# Patient Record
Sex: Male | Born: 1955
Health system: Southern US, Community
[De-identification: ages and names within clinical notes are randomized; demographics above are authoritative.]

---

## 2003-02-06 ENCOUNTER — Ambulatory Visit (HOSPITAL_BASED_OUTPATIENT_CLINIC_OR_DEPARTMENT_OTHER): Admission: RE | Admit: 2003-02-06 | Discharge: 2003-02-06 | Payer: Self-pay | Admitting: Orthopedic Surgery

## 2007-10-08 ENCOUNTER — Encounter (INDEPENDENT_AMBULATORY_CARE_PROVIDER_SITE_OTHER): Payer: Self-pay | Admitting: Surgery

## 2007-10-08 ENCOUNTER — Ambulatory Visit (HOSPITAL_BASED_OUTPATIENT_CLINIC_OR_DEPARTMENT_OTHER): Admission: RE | Admit: 2007-10-08 | Discharge: 2007-10-08 | Payer: Self-pay | Admitting: Surgery

## 2010-06-03 ENCOUNTER — Ambulatory Visit (HOSPITAL_BASED_OUTPATIENT_CLINIC_OR_DEPARTMENT_OTHER)
Admission: RE | Admit: 2010-06-03 | Discharge: 2010-06-03 | Disposition: A | Discharge status: Home / Self Care | Payer: BC Managed Care – PPO | Source: Ambulatory Visit | Attending: Orthopedic Surgery | Admitting: Orthopedic Surgery

## 2010-06-03 DIAGNOSIS — M7500 Adhesive capsulitis of unspecified shoulder: Secondary | ICD-10-CM | POA: Insufficient documentation

## 2010-06-03 DIAGNOSIS — M25819 Other specified joint disorders, unspecified shoulder: Secondary | ICD-10-CM | POA: Insufficient documentation

## 2010-06-03 DIAGNOSIS — Z01810 Encounter for preprocedural cardiovascular examination: Secondary | ICD-10-CM | POA: Insufficient documentation

## 2010-06-03 DIAGNOSIS — Z01812 Encounter for preprocedural laboratory examination: Secondary | ICD-10-CM | POA: Insufficient documentation

## 2010-06-07 NOTE — Op Note (Signed)
  NAME:  Daniel Shannon, Daniel Shannon NO.:  [id number]  MEDICAL RECORD NO.:  [id number]          PATIENT TYPE:  LOCATION:                                 FACILITY:  PHYSICIAN:  Loreta Ave, M.D.      DATE OF BIRTH:  DATE OF PROCEDURE: DATE OF DISCHARGE:                              OPERATIVE REPORT   PREOPERATIVE DIAGNOSIS:  Left shoulder impingement, distal clavicle osteolysis secondary to marked adhesive capsulitis.  POSTOPERATIVE DIAGNOSIS:  Left shoulder impingement, distal clavicle osteolysis secondary to marked adhesive capsulitis.  PROCEDURE:  Left shoulder exam manipulation under anesthesia. Arthroscopy with lysis and debridement of intra and extra-articular adhesions.  Bursectomy, acromioplasty, CA ligament release.  Excision of distal clavicle.  SURGEON:  Loreta Ave, MD  ASSISTANT:  Genene Churn. Barry Dienes, [place], present throughout the entire case necessary for timely completion of procedure.  ANESTHESIA:  General  BLOOD LOSS:  Minimal.  SPECIMEN:  None.  COMPLICATIONS:  None.  DRESSINGS:  Soft compressive with sling.  PROCEDURE IN DETAIL:  The patient brought to the operating room and after adequate anesthesia had been obtained, shoulder examined.  Lacking motion by 50% from dense adhesions from adhesive capsulitis.  Gently manipulated, break up all adhesions achieving full motion stable shoulder.  Placed in beach-chair position on the shoulder positioner and prepped and draped in usual sterile fashion.  Three portals anterior, posterior, and lateral.  Arthroscope introduced through the posterior portal.  Shoulder distended and inspected.  Residual adhesions and synovitis debrided.  Tearing of the inferior capsule from manipulation confirmed.  Articular cartilage, labrum, biceps tendon, biceps anchor, undersurface cuff all looked good.  Cannula redirected subacromially. Fair amount of adhesions there as well.  Debrided out.  Reactive bursitis  debrided.  Type II acromion.  Acromioplasty to a type 1 acromion with shaver and high-speed bur releasing CA ligament with cautery.  Distal clavicle exposed.  Periarticular spurs.  Grade 4 changes.  Periarticular spurs and lateral centimeter of clavicle resected.  Roughening on the top of the cuff also thoroughly debrided out.  At completion adequacy of acromioplasty, distal clavicle excision confirmed.  Instruments and fluid removed.  Portals closed with nylon.  Sterile compressive dressing applied.  Sling applied.  Anesthesia reversed.  Brought to recovery room.  Tolerated surgery well.  No complications.     Loreta Ave, M.D.     DFM/MEDQ  D:  06/03/2010  T:  06/04/2010  Job:  [id number]  Electronically Signed by Mckinley Jewel M.D. on 06/07/2010 02:59:19 PM

## 2010-08-31 NOTE — Op Note (Signed)
NAMEGREGORI, Daniel Shannon                ACCOUNT NO.:  [id number]   MEDICAL RECORD NO.:  [id number]          PATIENT TYPE:  AMB   LOCATION:  DSC                          FACILITY:  [facility]   PHYSICIAN:  Currie Paris, M.D.DATE OF BIRTH:  1955-07-26   DATE OF PROCEDURE:  10/08/2007  DATE OF DISCHARGE:                               OPERATIVE REPORT   PREOPERATIVE DIAGNOSES:  1. Skin lesion, right forearm question basal cell.  2. Skin lesions x2, left upper arm question basal cell.   CLINICAL HISTORY:  This patient is a 55 year old who has had a prior  basal cell removed from his arm and has a second lesion now at the  distal right forearm that is a little bit raised and irregular in shape.  We elected to remove this.  In addition, there were 2 adjacent lesions  in the left upper arm that although had been present for years.  He had  become concerned about given his prior history of basal cell and he  elected to remove those as well.   DESCRIPTION OF PROCEDURE:  The patient was seen in the minor procedure  room and confirmed the plans as noted above.  All three lesions were  identified and marked.   Both areas were then prepped with some alcohol and infiltrated with 1%  Xylocaine with epinephrine.  I waited about 10 minutes and began first  on the right forearm lesion.  The area was prepped with Betadine.  An  elliptical incision was made in the area taken out with about a 3-cm  incision.  The lesion itself measured 12 mm.   This incision was closed with a series of interrupted 3-0 Vicryl subcu  and then a 4-0 running Prolene skin.   Two lesions of the forearm were then addressed.  I outlined and marked a  couple of varieties of incisions including two separate incisions that  were paralleled, one somewhat longer but transverse the oriented  incision that I thought would encompass both lesions.  They were close  together and I did not think a long vertical incision was going to  properly get both of these and have a skin bridge that would be healthy.  This area had been infiltrated at the beginning of the case and was  infiltrated with a little bit of more local.  This was then prepped with  Betadine and an elliptical incision was made encompassing both lesions.  This was again closed with 3-0 Vicryl and 4-0 Prolene.   The patient tolerated the procedure well and no complications noted.      Currie Paris, M.D.  Electronically Signed     CJS/MEDQ  D:  10/08/2007  T:  10/09/2007  Job:  [id number]

## 2010-09-03 NOTE — Op Note (Signed)
NAMECHADRIC, Daniel Shannon NO.:  [id number]   MEDICAL RECORD NO.:  [id number]                   PATIENT TYPE:  AMB   LOCATION:  DSC                                  FACILITY:  [facility]   PHYSICIAN:  Loreta Ave, M.D.              DATE OF BIRTH:  1955/11/26   DATE OF PROCEDURE:  02/06/2003  DATE OF DISCHARGE:                                 OPERATIVE REPORT   PREOPERATIVE DIAGNOSES:  1. Chronic impingement, right shoulder, with partial thickness tear of the     rotator cuff.  2. 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  3. Marked reactive secondary adhesive capsulitis.   POSTOPERATIVE DIAGNOSES:  1. Chronic impingement, right shoulder, with partial thickness tear of the     rotator cuff.  2. 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  3. Marked reactive secondary adhesive capsulitis.  4. Additional labral tears.   PROCEDURE:  1. Right shoulder examination with manipulation under anesthesia, followed     by an arthroscopy with lysis and debridement of intra-articular and extra-     articular adhesions.  2. Debridement of labrum.  3. Debridement of the rotator cuff.  4. Acromioplasty.  5. Release of the ligament.  6. Excision of distal clavicle.   SURGEON:  Loreta Ave, M.D.   ASSISTANT:  Arlys John D. Petrarca, P.A.-C.   ANESTHESIA:  General.   ESTIMATED BLOOD LOSS:  Minimal.   SPECIMENS:  None.   CULTURES:  None.   COMPLICATIONS:  None.   DRESSING:  A soft compressive with a sling.   DESCRIPTION OF PROCEDURE:  The patient is brought to the operating room and  placed on the operating room table in the supine position.  After adequate  anesthesia had been obtained, the right shoulder was examined.  Marked  adhesive capsulitis with a reduction of motion more than 50% in all planes.  Under general anesthesia with gentle manipulation, I was able to break up  all adhesions and achieve a full motion of the  shoulder and maintain a  stable shoulder without adverse occurrence.  Following this, placed in the  beach chair position in the shoulder positioner.  Prepped and draped in the  usual sterile fashion.  Three standard portals were anterior to  posterolateral.  The shoulder entered with a blunt obturator and distended  and expected.  Marked intra-articular adhesions debrided.  The  circumferential tear of the labrum was debrided.  The articular cartilage  and capsule and ligamentous structures were under the rotator cuff.  The  biceps tendon, biceps anchor otherwise intact.  The cannula was redirected  subacromially.  Marked extra-articular adhesions, bursitis, and impingement.  The bursa was resected.  The adhesions were debrided.  The acromioplasty  from the type 3 to type 1 acromion, releasing the CA  ligament and debriding  the top of the cuff.  No full thickness tears  to require overt repair.  Grade 3 changes to the [facility] joint with spurring contributing to impingement.  The lateral 1.0 cm of clavicle was resected.  At the completion, adequate CA  decompression.  Clavicle incision and debridement of the cuff, and  debridement of adhesions confirmed viewing from all portals.  The  instruments and fluid were removed.  The portals were found, immersed and  injected with Marcaine.  The portals were closed with #4-0 nylon.  A sterile  compressive dressing was applied.  The anesthesia was reversed.  The patient was brought to the recovery room.  The patient tolerated the  surgery well with no complications.                                                Loreta Ave, M.D.    DFM/MEDQ  D:  02/06/2003  T:  02/06/2003  Job:  [id number]

## 2017-02-10 ENCOUNTER — Other Ambulatory Visit: Payer: Self-pay | Admitting: Surgery

## 2019-01-30 DIAGNOSIS — Z03818 Encounter for observation for suspected exposure to other biological agents ruled out: Secondary | ICD-10-CM | POA: Diagnosis not present

## 2019-05-26 DIAGNOSIS — F5105 Insomnia due to other mental disorder: Secondary | ICD-10-CM | POA: Diagnosis not present

## 2019-05-26 DIAGNOSIS — F39 Unspecified mood [affective] disorder: Secondary | ICD-10-CM | POA: Diagnosis not present

## 2019-05-27 ENCOUNTER — Inpatient Hospital Stay (HOSPITAL_COMMUNITY)
Admission: AD | Admit: 2019-05-27 | Discharge: 2019-06-04 | DRG: 885 | Disposition: A | Discharge status: Home / Self Care | Payer: BC Managed Care – PPO | Source: Intra-hospital | Attending: Psychiatry | Admitting: Psychiatry

## 2019-05-27 ENCOUNTER — Other Ambulatory Visit: Payer: Self-pay

## 2019-05-27 ENCOUNTER — Encounter (HOSPITAL_COMMUNITY): Payer: Self-pay | Admitting: Registered Nurse

## 2019-05-27 ENCOUNTER — Emergency Department (HOSPITAL_COMMUNITY)
Admission: EM | Admit: 2019-05-27 | Discharge: 2019-05-27 | Disposition: A | Discharge status: Other Acute Inpatient Hospital | Payer: BC Managed Care – PPO | Source: Home / Self Care

## 2019-05-27 DIAGNOSIS — Z20822 Contact with and (suspected) exposure to covid-19: Secondary | ICD-10-CM | POA: Diagnosis not present

## 2019-05-27 DIAGNOSIS — F312 Bipolar disorder, current episode manic severe with psychotic features: Secondary | ICD-10-CM

## 2019-05-27 DIAGNOSIS — E559 Vitamin D deficiency, unspecified: Secondary | ICD-10-CM | POA: Diagnosis not present

## 2019-05-27 DIAGNOSIS — Z23 Encounter for immunization: Secondary | ICD-10-CM

## 2019-05-27 DIAGNOSIS — R4781 Slurred speech: Secondary | ICD-10-CM | POA: Diagnosis not present

## 2019-05-27 DIAGNOSIS — F309 Manic episode, unspecified: Secondary | ICD-10-CM

## 2019-05-27 DIAGNOSIS — F308 Other manic episodes: Secondary | ICD-10-CM | POA: Insufficient documentation

## 2019-05-27 DIAGNOSIS — Z79899 Other long term (current) drug therapy: Secondary | ICD-10-CM

## 2019-05-27 DIAGNOSIS — F22 Delusional disorders: Secondary | ICD-10-CM | POA: Diagnosis not present

## 2019-05-27 DIAGNOSIS — G4700 Insomnia, unspecified: Secondary | ICD-10-CM | POA: Diagnosis not present

## 2019-05-27 DIAGNOSIS — Z9114 Patient's other noncompliance with medication regimen: Secondary | ICD-10-CM

## 2019-05-27 DIAGNOSIS — Z818 Family history of other mental and behavioral disorders: Secondary | ICD-10-CM

## 2019-05-27 DIAGNOSIS — I10 Essential (primary) hypertension: Secondary | ICD-10-CM | POA: Diagnosis not present

## 2019-05-27 DIAGNOSIS — G9389 Other specified disorders of brain: Secondary | ICD-10-CM | POA: Diagnosis not present

## 2019-05-27 DIAGNOSIS — F319 Bipolar disorder, unspecified: Principal | ICD-10-CM | POA: Diagnosis present

## 2019-05-27 DIAGNOSIS — F29 Unspecified psychosis not due to a substance or known physiological condition: Secondary | ICD-10-CM | POA: Diagnosis not present

## 2019-05-27 DIAGNOSIS — F419 Anxiety disorder, unspecified: Secondary | ICD-10-CM | POA: Diagnosis present

## 2019-05-27 DIAGNOSIS — Z03818 Encounter for observation for suspected exposure to other biological agents ruled out: Secondary | ICD-10-CM | POA: Diagnosis not present

## 2019-05-27 LAB — COMPREHENSIVE METABOLIC PANEL
ALT: 33 U/L (ref 0–44)
AST: 38 U/L (ref 15–41)
Albumin: 4.5 g/dL (ref 3.5–5.0)
Alkaline Phosphatase: 64 U/L (ref 38–126)
Anion gap: 11 (ref 5–15)
BUN: 21 mg/dL (ref 8–23)
CO2: 21 mmol/L — ABNORMAL LOW (ref 22–32)
Calcium: 9 mg/dL (ref 8.9–10.3)
Chloride: 105 mmol/L (ref 98–111)
Creatinine, Ser: 1.29 mg/dL — ABNORMAL HIGH (ref 0.61–1.24)
GFR calc Af Amer: >60 mL/min (ref >60)
GFR calc non Af Amer: 58 mL/min — ABNORMAL LOW (ref >60)
Glucose, Bld: 133 mg/dL — ABNORMAL HIGH (ref 70–99)
Potassium: 3.9 mmol/L (ref 3.5–5.1)
Sodium: 137 mmol/L (ref 135–145)
Total Bilirubin: 1.6 mg/dL — ABNORMAL HIGH (ref 0.3–1.2)
Total Protein: 7.4 g/dL (ref 6.5–8.1)

## 2019-05-27 LAB — CBC WITH DIFFERENTIAL/PLATELET
Abs Immature Granulocytes: 0.03 10*3/uL (ref 0.00–0.07)
Basophils Absolute: 0.1 10*3/uL (ref 0.0–0.1)
Basophils Relative: 0 %
Eosinophils Absolute: 0 10*3/uL (ref 0.0–0.5)
Eosinophils Relative: 0 %
HCT: 43 % (ref 39.0–52.0)
Hemoglobin: 14.6 g/dL (ref 13.0–17.0)
Immature Granulocytes: 0 %
Lymphocytes Relative: 30 %
Lymphs Abs: 3.4 10*3/uL (ref 0.7–4.0)
MCH: 32.1 pg (ref 26.0–34.0)
MCHC: 34 g/dL (ref 30.0–36.0)
MCV: 94.5 fL (ref 80.0–100.0)
Monocytes Absolute: 0.9 10*3/uL (ref 0.1–1.0)
Monocytes Relative: 8 %
Neutro Abs: 7 10*3/uL (ref 1.7–7.7)
Neutrophils Relative %: 62 %
Platelets: 207 10*3/uL (ref 150–400)
RBC: 4.55 MIL/uL (ref 4.22–5.81)
RDW: 12.1 % (ref 11.5–15.5)
WBC: 11.4 10*3/uL — ABNORMAL HIGH (ref 4.0–10.5)
nRBC: 0 % (ref 0.0–0.2)

## 2019-05-27 LAB — RESPIRATORY PANEL BY RT PCR (FLU A&B, COVID)
Influenza A by PCR: NEGATIVE
Influenza B by PCR: NEGATIVE
SARS Coronavirus 2 by RT PCR: NEGATIVE

## 2019-05-27 LAB — VALPROIC ACID LEVEL: Valproic Acid Lvl: 15 ug/mL — ABNORMAL LOW (ref 50.0–100.0)

## 2019-05-27 LAB — ETHANOL: Alcohol, Ethyl (B): <10 mg/dL (ref <10)

## 2019-05-27 MED ORDER — DIVALPROEX SODIUM 250 MG PO DR TAB
250.0000 mg | DELAYED_RELEASE_TABLET | Freq: Three times a day (TID) | ORAL | Status: DC
  Administered 2019-05-27 – 2019-05-28 (×2): 250 mg via ORAL
  Filled 2019-05-27 (×7): qty 1

## 2019-05-27 MED ORDER — OLANZAPINE 10 MG IM SOLR
10.0000 mg | Freq: Three times a day (TID) | INTRAMUSCULAR | Status: DC | PRN
  Filled 2019-05-27: qty 10

## 2019-05-27 MED ORDER — LORAZEPAM 1 MG PO TABS
1.0000 mg | ORAL_TABLET | ORAL | Status: DC | PRN
  Administered 2019-05-28: 1 mg via ORAL
  Filled 2019-05-27: qty 1

## 2019-05-27 MED ORDER — OLANZAPINE 10 MG IM SOLR: 10.0000 mg | Freq: Three times a day (TID) | INTRAMUSCULAR | Status: DC

## 2019-05-27 MED ORDER — ZIPRASIDONE MESYLATE 20 MG IM SOLR
20.0000 mg | Freq: Once | INTRAMUSCULAR | Status: AC
  Administered 2019-05-27: 20 mg via INTRAMUSCULAR
  Filled 2019-05-27: qty 20

## 2019-05-27 MED ORDER — STERILE WATER FOR INJECTION IJ SOLN
INTRAMUSCULAR | Status: AC
  Filled 2019-05-27: qty 10

## 2019-05-27 MED ORDER — OLANZAPINE 10 MG IM SOLR: 10.0000 mg | Freq: Three times a day (TID) | INTRAMUSCULAR | Status: DC | PRN

## 2019-05-27 MED ORDER — TEMAZEPAM 30 MG PO CAPS
30.0000 mg | ORAL_CAPSULE | Freq: Every day | ORAL | Status: DC
  Filled 2019-05-27: qty 1

## 2019-05-27 MED ORDER — LORAZEPAM 2 MG/ML IJ SOLN: 2.0000 mg | INTRAMUSCULAR | Status: DC | PRN

## 2019-05-27 MED ORDER — DIVALPROEX SODIUM 125 MG PO CSDR
250.0000 mg | DELAYED_RELEASE_CAPSULE | Freq: Three times a day (TID) | ORAL | Status: DC
  Filled 2019-05-27 (×3): qty 2

## 2019-05-27 MED ORDER — OLANZAPINE 10 MG PO TABS
10.0000 mg | ORAL_TABLET | Freq: Two times a day (BID) | ORAL | Status: DC
  Administered 2019-05-27 – 2019-05-28 (×2): 10 mg via ORAL
  Filled 2019-05-27 (×5): qty 1

## 2019-05-27 MED ORDER — LORAZEPAM 1 MG PO TABS
2.0000 mg | ORAL_TABLET | ORAL | Status: DC | PRN
  Filled 2019-05-27: qty 2

## 2019-05-27 NOTE — ED Triage Notes (Signed)
Pt presents to ED brought in by neighbor and family for delusions and abnormal, manic like behavior.

## 2019-05-27 NOTE — ED Notes (Signed)
Pt provided with hospital bed. Pt also provided with paper and pencil per his request. Pt is calm and cooperative at this time.

## 2019-05-27 NOTE — ED Notes (Signed)
Sitter has been assigned to sit with pt.

## 2019-05-27 NOTE — ED Notes (Signed)
Pharmacy reports that Cephus Shelling, MD prescribed pts new meds that were started yesterday.

## 2019-05-27 NOTE — Progress Notes (Signed)
Patient ID: Daniel Shannon, male   DOB: 07-Jun-1955, 64 y.o.   MRN: [id number]  Admission Note  Pt is a 64 yo male that presents IVC'd on 05/27/2019 with worsening hyperactivity, anxiety, agitation, compulsions, and insomnia. Pt states they are unsure as to how they came to the hospital. Pt was IVC'd and is preoccupied with being here without the magistrate and MD knowing "who I am". Pt is fixated on scribing their conversations with others. Pt is a poor history but pleasant. Pt states they are willing to take medications but wants to be "explained what they are and what they do". Pt is fixated on discharge but understands they cannot go. "My best option would be to sleep in my bed at home. I know I need sleep". Pt denies any physical pain. Pt denies any physical symptoms. Pt did state their sister was admitted for bipolar in 1994. Pt states they had multiple stressors leading up to this event that they can recall including helping a brother with their medical practice. Pt states they live at home with wife and kids. Pt is a Education officer, environmental at [employer]. Pt is pleasant. Pt denies si/hi/ah/vh and verbally agrees to approach staff if these become apparent or before harming themself/others while at [place] signed, skin/belongings search completed and patient oriented to unit. Patient stable at this time. Patient given the opportunity to express concerns and ask questions. Patient given toiletries. Will continue to monitor.   [place] Assessment 05/27/2019:  Daniel Shannon is an 64 y.o. male that presents this date with IVC due to acute psychosis. Per IVC, Patient has been exhibiting manic behaviors and hasn't been sleeping. Patient is observed to be paranoid. He went to the home of the hospital CEO last night unannounced. He did this because he was paranoid about his phone "pinging" and he thought he needed to discuss this with CEO despite not actually knowing him. Patient is observed to be tangential and renders limited  history as this Probation officer attempts to assess. Patient per notes by EDP Kohut MD on arrival, states patient was brought in by his wife earlier for a psychiatric evaluation. 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 Patient has had paranoid thoughts which led him to go the home of the hospital CEO unannounced prior to arrival. Patient is observed to be very agitated as this Probation officer attempts to assess. Patient is refusing to participate in the assessment and is stating he will not answer any questions associated with his current mental health condition without the interview being recorded. Patient is observed to be writing everything down and is asking for random names of individuals he feels are associated with his care. Patient voices "everyone is after him" although will not elaborate on content of statement. Patient continues to offer this Probation officer financial compensation to "tell him the truth." This Probation officer attempts to redirect patient unsuccessfully. Patient will talk at length on various unrelated topics informing this writer he will answer any questions if I will go get "two five dollar bills." When this writer informs patient that is not possible patient states he will "answer all relevant questions." Patient denies any S/I,H/I or AVH. Patient denies any prior attempts or gestures at self harm Patient denies any past or current SA issues. Patient denies any prior mental health history although this writer is uncertain if patient is comprehending the content of this writer's questions.  Patient is observed to be hitting his bed with his hand and yelling "no, no, no, to everything you ask." This writer terminates interview as patient is observed to become highly agitated and is asking this Probation officer to "leave the room immediately." Patient declines to give consent to "speak to anyone" in reference to any  collateral information. History is limited per chart review. To note RN Lacinda Axon documented that Nicolasa Ducking MD prescribed patient new medication yesterday. See MAR. Patientpresentswith agitated affect and speaks in a loud voice with pressured speech. Patient will not answer orientation questions although states "I know where I am." Patient'smood wasangry and anxious. Patient'saffect was congruent with mood. Patient'sthought process was tangential and patient was observed to be in a highly manic state.Patient'sjudgement wasimpaired. Patient'sconcentration waspooras patient displayed some paranoia.Patient'sinsight and impulse control arepoor. Patient did not appear to be responding to internal stimuli.Case was staffed with Dwyane Dee MD who recommended a inpatient admission to assist with stabilization.

## 2019-05-27 NOTE — ED Notes (Signed)
Pt remains manic, states we are all lying to him. Will will not allow staff to complete a sentence prior to rambling on in a manic state. He is presently at desk with transport and will not leave facility due to his wife not being present to accompany him across to [facility].

## 2019-05-27 NOTE — BH Assessment (Signed)
[place] Assessment Progress Note   Pt unable to be assessed at this time.  Pt given geodon at 01:05.  Nurse Grandville Silos said pt was sleeping now.

## 2019-05-27 NOTE — ED Notes (Signed)
Pt remains in bed resting at this time. NAD noted. Equal rise and fall of chest noted.

## 2019-05-27 NOTE — BHH Suicide Risk Assessment (Signed)
[facility] Admission Suicide Risk Assessment   Total Time spent with patient: 45 minutes Principal Problem: New onset mania Diagnosis:  Active Problems:   Bipolar disorder ([place])  Subjective Data: New onset mania in the context of a family history and  insomnia and stress  Continued Clinical Symptoms:    The "Alcohol Use Disorders Identification Test", Guidelines for Use in Primary Care, Second Edition.  World Pharmacologist [facility]). Score between 0-7:  no or low risk or alcohol related problems. Score between 8-15:  moderate risk of alcohol related problems. Score between 16-19:  high risk of alcohol related problems. Score 20 or above:  warrants further diagnostic evaluation for alcohol dependence and treatment.   CLINICAL FACTORS:  Insomnia Musculoskeletal: Strength & Muscle Tone: within normal limits Gait & Station: normal Patient leans: N/A  Psychiatric Specialty Exam: Physical Exam  Nursing note and vitals reviewed. Constitutional: He appears well-developed and well-nourished.    Review of Systems  Neurological: Negative.   Generally very physically fit and runs half marathons is a triathlete so forth no alcohol or drug use  There were no vitals taken for this visit.There is no height or weight on file to calculate BMI.  General Appearance: Casual/in scrubs  Eye Contact:  Fair  Speech:  Pressured/rambling  Volume:  Increased  Mood:  manic  Affect:  Full Range  Thought Process:  Irrelevant and Descriptions of Associations: Circumstantial/very obsessive, obsessed with writing every detail down as it is occurring, refusing to answer multiple questions but each question of the mental status exam is a springboard for him to question the examiner  Orientation:  Full (Time, Place, and Person)  Thought Content:  Paranoid Ideation/thoughts are clearly racing and patient believes no one is advocating for him.  Suicidal Thoughts:  No  Homicidal Thoughts:  No  Memory:  unable to  test fromally due to continual deflection of questions   Judgement:  Impaired  Insight:  Lacking  Psychomotor Activity:  Restlessness  Concentration:  Concentration: Poor and Attention Span: Poor  Recall:  Poor  Fund of Knowledge:  Good  Language:  Good  Akathisia:  Negative  Handed:  Right  AIMS (if indicated):     Assets:  Agricultural consultant Housing Intimacy Physical Health Resilience Social Support Talents/Skills Transportation Vocational/Educational  ADL's:  Intact  Cognition:  Impaired,  Mild  Sleep:        COGNITIVE FEATURES THAT CONTRIBUTE TO RISK:  Polarized thinking    SUICIDE RISK:   Minimal: No identifiable suicidal ideation.  Patients presenting with no risk factors but with morbid ruminations; may be classified as minimal risk based on the severity of the depressive symptoms  PLAN OF CARE: Admit for stabilization  I certify that inpatient services furnished can reasonably be expected to improve the patient's condition.   Johnn Hai, MD 05/27/2019, 2:10 PM

## 2019-05-27 NOTE — ED Notes (Signed)
Pt ambulated to restroom before staff could ask him to provide a urine sample.

## 2019-05-27 NOTE — ED Notes (Signed)
Sheriff notified need for IVC transport to [facility]

## 2019-05-27 NOTE — H&P (Signed)
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  His brother had become depressed and the patient had been traveling to help him and going without sleep, in fact Friday night 2/5, did not sleep at all. (Other pertinent family history includes a sister with a bipolar disorder requiring hospitalization in 1994 but she is stable/functional, and a professor at [employer])  The patient however became progressively more manic, neighbors noted his behavior and were concerned, and by Saturday night he actually went to the home of the hospital CEO and handed over a note regarding apparently his concerns about a hospital merger and little or he ran down the steps and away- The patient saw Dr. Nicolasa Ducking on Saturday, and she was concerned about manic symptomatology prescribed quetiapine and Depakote, the patient did take 1 dose of one of the medications but was not fully compliant, and his behavior escalated to the current status requiring petition for involuntary commitment.  The patient is literally impossible to interview-he interrupts the examiner repeatedly, he insists on writing everything down, literally writing "I am coming in a room and it is cold" he requests a scribe to write everything  down.  He claims that we are not his advocates and that people are working against him.  When asked how I could be his advocate he states by releasing him.  Anything short of an immediate discharge means that we are not his advocate.  His wife confirms that he has been using that term repeatedly.  When I try to conduct a basic interview and history of the present illness he replies with "you do not know my story" "you do not know anything about me" and becomes argumentative.  He repeatedly takes over the interview and asks the examiner questions over and over and requires constant redirection. See mental status exam below  Further history indicates that is the 1 year anniversary of his mother's death Associated Signs/Symptoms: Depression Symptoms:  insomnia, psychomotor agitation, difficulty concentrating, disturbed sleep, (Hypo) Manic Symptoms:  Delusions, Distractibility, Flight of Ideas, Anxiety Symptoms:  denies Psychotic Symptoms:  Paranoia, PTSD Symptoms: NA Total Time spent with patient: 45 minutes  Past Psychiatric History: denies  Is the patient at risk to self? Yes.    Has the patient been a risk to self in the past 6 months? No.  Has the patient been a risk to self within the distant past? No.  Is the patient a risk to others? No.  Has the patient been a risk to others in the past 6 months? No.  Has the patient been a risk to others within the distant past? No.   Prior Inpatient Therapy: Prior Inpatient Therapy: No(Per patient) Prior Outpatient Therapy: Prior Outpatient Therapy: No(Per patient) Does patient have an ACCT team?: No Does patient have Intensive In-House Services?  : No Does patient have  [facility] services? : No Does patient have P4CC services?: No  Alcohol Screening:   Substance Abuse History in the last 12 months:  No. Consequences of Substance Abuse: NA Previous Psychotropic Medications: Yes But just 1 or 2 doses Psychological Evaluations: No  Past  Medical History: No past medical history on file.  Family History: No family history on file. Family Psychiatric  History: brother MDD- sister Bipolar d/o 1 hospitalization  Tobacco Screening:  neg Social History:  Social History   Substance and Sexual Activity  Alcohol Use Not on file     Social History   Substance and Sexual Activity  Drug Use Not on file    Additional Social History: Marital status: Married    Pain Medications: See MAR Prescriptions: See MAR Over the Counter: See MAR History of alcohol / drug use?: No history of alcohol / drug abuse                    Allergies:  No Known Allergies Lab Results:  Results for orders placed or performed during the hospital encounter of 05/27/19 (from the past 48 hour(s))  Comprehensive metabolic panel     Status: Abnormal   Collection Time: 05/27/19  1:00 AM  Result Value Ref Range   Sodium 137 135 - 145 mmol/L   Potassium 3.9 3.5 - 5.1 mmol/L   Chloride 105 98 - 111 mmol/L   CO2 21 (L) 22 - 32 mmol/L   Glucose, Bld 133 (H) 70 - 99 mg/dL   BUN 21 8 - 23 mg/dL   Creatinine, Ser 1.29 (H) 0.61 - 1.24 mg/dL   Calcium 9.0 8.9 - 10.3 mg/dL   Total Protein 7.4 6.5 - 8.1 g/dL   Albumin 4.5 3.5 - 5.0 g/dL   AST 38 15 - 41 U/L   ALT 33 0 - 44 U/L   Alkaline Phosphatase 64 38 - 126 U/L   Total Bilirubin 1.6 (H) 0.3 - 1.2 mg/dL   GFR calc non Af Amer 58 (L) >60 mL/min   GFR calc Af Amer >60 >60 mL/min   Anion gap 11 5 - 15    Comment: Performed at [facility], [place] [address], [place], [place] [address]  Ethanol     Status: None   Collection Time: 05/27/19  1:00 AM  Result Value Ref Range   Alcohol, Ethyl (B) <10 <10 mg/dL    Comment: (NOTE) Lowest detectable limit for serum alcohol is 10 mg/dL. For medical purposes only. Performed at [facility], [place] [address] [place], [place] [address]   CBC with Diff     Status: Abnormal   Collection Time: 05/27/19  1:00 AM  Result  Value Ref Range   WBC 11.4 (H) 4.0 - 10.5 K/uL   RBC 4.55 4.22 - 5.81 MIL/uL   Hemoglobin 14.6 13.0 - 17.0 g/dL   HCT 43.0 39.0 - 52.0 %   MCV 94.5 80.0 - 100.0 fL   MCH 32.1 26.0 - 34.0 pg   MCHC 34.0 30.0 - 36.0 g/dL   RDW 12.1 11.5 - 15.5 %   Platelets 207 150 - 400 K/uL   nRBC 0.0 0.0 - 0.2 %   Neutrophils Relative % 62 %   Neutro Abs 7.0 1.7 - 7.7 K/uL   Lymphocytes Relative 30 %   Lymphs Abs 3.4 0.7 - 4.0 K/uL   Monocytes Relative 8 %   Monocytes Absolute 0.9 0.1 - 1.0 K/uL   Eosinophils Relative 0 %  Eosinophils Absolute 0.0 0.0 - 0.5 K/uL   Basophils Relative 0 %   Basophils Absolute 0.1 0.0 - 0.1 K/uL   Immature Granulocytes 0 %   Abs Immature Granulocytes 0.03 0.00 - 0.07 K/uL    Comment: Performed at [facility], [place] [address], [place], [place] [address]  Valproic acid level     Status: Abnormal   Collection Time: 05/27/19  1:00 AM  Result Value Ref Range   Valproic Acid Lvl 15 (L) 50.0 - 100.0 ug/mL    Comment: Performed at [facility], [place] [address] [place], [place] [address]  Respiratory Panel by RT PCR (Flu A&B, Covid) - Nasopharyngeal Swab     Status: None   Collection Time: 05/27/19  1:05 AM   Specimen: Nasopharyngeal Swab  Result Value Ref Range   SARS Coronavirus 2 by RT PCR NEGATIVE NEGATIVE    Comment: (NOTE) SARS-CoV-2 target nucleic acids are NOT DETECTED. The SARS-CoV-2 RNA is generally detectable in upper respiratoy specimens during the acute phase of infection. The lowest concentration of SARS-CoV-2 viral copies this assay can detect is 131 copies/mL. A negative result does not preclude SARS-Cov-2 infection and should not be used as the sole basis for treatment or other patient management decisions. A negative result may occur with  improper specimen collection/handling, submission of specimen other than nasopharyngeal swab, presence of viral mutation(s) within the areas targeted by this assay, and  inadequate number of viral copies (<131 copies/mL). A negative result must be combined with clinical observations, patient history, and epidemiological information. The expected result is Negative. Fact Sheet for Patients:  PinkCheek.be Fact Sheet for Healthcare Providers:  GravelBags.it This test is not yet ap proved or cleared by the [place] FDA and  has been authorized for detection and/or diagnosis of SARS-CoV-2 by FDA under an Emergency Use Authorization (EUA). This EUA will remain  in effect (meaning this test can be used) for the duration of the COVID-19 declaration under Section 564(b)(1) of the Act, 21 U.S.C. section 360bbb-3(b)(1), unless the authorization is terminated or revoked sooner.    Influenza A by PCR NEGATIVE NEGATIVE   Influenza B by PCR NEGATIVE NEGATIVE    Comment: (NOTE) The Xpert Xpress SARS-CoV-2/FLU/RSV assay is intended as an aid in  the diagnosis of influenza from Nasopharyngeal swab specimens and  should not be used as a sole basis for treatment. Nasal washings and  aspirates are unacceptable for Xpert Xpress SARS-CoV-2/FLU/RSV  testing. Fact Sheet for Patients: PinkCheek.be Fact Sheet for Healthcare Providers: GravelBags.it This test is not yet approved or cleared by the [place] FDA and  has been authorized for detection and/or diagnosis of SARS-CoV-2 by  FDA under an Emergency Use Authorization (EUA). This EUA will remain  in effect (meaning this test can be used) for the duration of the  Covid-19 declaration under Section 564(b)(1) of the Act, 21  U.S.C. section 360bbb-3(b)(1), unless the authorization is  terminated or revoked. Performed at [facility], [place] [address], [place], [place] [address]     Blood Alcohol level:  Lab Results  Component Value Date   ETH <10 [id number]    Metabolic  Disorder Labs:  No results found for: HGBA1C, MPG No results found for: PROLACTIN No results found for: CHOL, TRIG, HDL, CHOLHDL, VLDL, LDLCALC  Current Medications: No current facility-administered medications for this encounter.   PTA Medications: Medications Prior to Admission  Medication Sig Dispense Refill Last Dose  . cholecalciferol (VITAMIN D3)  25 MCG (1000 UNIT) tablet Take 1,000 Units by mouth daily.     . divalproex (DEPAKOTE ER) 500 MG 24 hr tablet Take 1,000 mg by mouth at bedtime.     Marland Kitchen QUEtiapine (SEROQUEL) 50 MG tablet Take 50 mg by mouth at bedtime.       Musculoskeletal: Strength & Muscle Tone: within normal limits Gait & Station: normal Patient leans: N/A  Psychiatric Specialty Exam: Physical Exam  Nursing note and vitals reviewed. Constitutional: He appears well-developed and well-nourished.    Review of Systems  Neurological: Negative.   Generally very physically fit and runs half marathons is a triathlete so forth no alcohol or drug use  There were no vitals taken for this visit.There is no height or weight on file to calculate BMI.  General Appearance: Casual/in scrubs  Eye Contact:  Fair  Speech:  Pressured/rambling  Volume:  Increased  Mood:  manic  Affect:  Full Range  Thought Process:  Irrelevant and Descriptions of Associations: Circumstantial/very obsessive, obsessed with writing every detail down as it is occurring, refusing to answer multiple questions but each question of the mental status exam is a springboard for him to question the examiner  Orientation:  Full (Time, Place, and Person)  Thought Content:  Paranoid Ideation/thoughts are clearly racing and patient believes no one is advocating for him.  Suicidal Thoughts:  No  Homicidal Thoughts:  No  Memory:  unable to test fromally due to continual deflection of questions   Judgement:  Impaired  Insight:  Lacking  Psychomotor Activity:  Restlessness  Concentration:  Concentration: Poor  and Attention Span: Poor  Recall:  Poor  Fund of Knowledge:  Good  Language:  Good  Akathisia:  Negative  Handed:  Right  AIMS (if indicated):     Assets:  Agricultural consultant Housing Intimacy Physical Health Resilience Social Support Talents/Skills Transportation Vocational/Educational  ADL's:  Intact  Cognition:  Impaired,  Mild  Sleep:       Treatment Plan Summary: Daily contact with patient to assess and evaluate symptoms and progress in treatment and Medication management  Observation Level/Precautions:  15 minute checks  Laboratory:  Chemistry Profile  Psychotherapy: Reality based med and illness education  Medications: Short-term will use injectable olanzapine may need to force and Dr. Parke Poisson will do forced med consultation  Consultations: Forced med consultation  Discharge Concerns: Long-term stability/preservation of career  Estimated LOS: 7-10  Other:    Axis I - new onset manic symptoms with paranoia/racing thoughts/erratic behavior, precipitated by psychosocial stress/insomnia  Rule out yet to be determined neurodegenerative process manifest as initial affect of symptomatology  Axis II none  Axis III hypertensive at present probably due to agitation as not known to be hypertensive at baseline  Plans  I discussed with the patient that his condition may represent his body's reaction to insomnia and stress, may also represent some yet to be determined neurodegenerative process and we recommended an MRI at some point in time. I elaborated that the patient would not get better without some medication and used in the short run and he is refusing medications this point in time. Discussions of risk/benefits/side effects are just a springboard for argument and not useful at this point in time  Forced med consultation pending  Discussed history and treatment plan with patient's wife   Physician Treatment Plan for Primary Diagnosis:  For mania administer short acting antipsychotic and administer Depakote for longer-term stability.  Long Term Goal(s): Improvement in symptoms  so as ready for discharge  Short Term Goals: Ability to identify changes in lifestyle to reduce recurrence of condition will improve  Physician Treatment Plan for Secondary Diagnosis: Active Problems:   Bipolar disorder ([place])  Long Term Goal(s): Improvement in symptoms so as ready for discharge  Short Term Goals: Ability to identify changes in lifestyle to reduce recurrence of condition will improve  I certify that inpatient services furnished can reasonably be expected to improve the patient's condition.    Johnn Hai, MD 2/8/20211:34 PM

## 2019-05-27 NOTE — ED Provider Notes (Signed)
[name] [name] DEPT Provider Note   CSN: [id number] Arrival date & time: 05/27/19  0006     History No chief complaint on file.   Daniel Shannon is a 64 y.o. male.  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 He did not go into detail about what exactly his brother was having difficulty with. His own behavior has become increasingly erratic. He has not been sleeping well. He has had paranoid thoughts which led him to go to the home of the hospital CEO unannounced last night.   No past medical history on file.  There are no problems to display for this patient.    No family history on file.  Social History   Tobacco Use  . Smoking status: Not on file  Substance Use Topics  . Alcohol use: Not on file  . Drug use: Not on file    Home Medications Prior to Admission medications   Not on File    Allergies    Patient has no allergy information on record.  Review of Systems   Review of Systems All systems reviewed and negative, other than as noted in HPI.  Physical Exam Updated Vital Signs There were no vitals taken for this visit.  Physical Exam Vitals and nursing note reviewed.  Constitutional:      General: He is not in acute distress.    Appearance: He is well-developed.  HENT:     Head: Normocephalic and atraumatic.  Eyes:     General:        Right eye: No discharge.        Left eye: No discharge.     Conjunctiva/sclera: Conjunctivae normal.  Cardiovascular:     Rate and Rhythm: Normal rate and regular rhythm.     Heart sounds: Normal heart sounds. No murmur. No friction rub. No gallop.   Pulmonary:     Effort: Pulmonary effort is normal. No respiratory distress.   Breath sounds: Normal breath sounds.  Abdominal:     General: There is no distension.     Palpations: Abdomen is soft.     Tenderness: There is no abdominal tenderness.  Musculoskeletal:        General: No tenderness.     Cervical back: Neck supple.  Skin:    General: Skin is warm and dry.  Neurological:     Mental Status: He is alert.  Psychiatric:     Comments: Speech is pressured. He will catch himself on occasion getting worked up and will pause to compose himself. Distracts easily.      ED Results / Procedures / Treatments   Labs (all labs ordered are listed, but only abnormal results are displayed) Labs Reviewed  RESPIRATORY PANEL BY RT PCR (FLU A&B, COVID)  COMPREHENSIVE METABOLIC PANEL  ETHANOL  RAPID URINE DRUG SCREEN, HOSP PERFORMED  CBC WITH DIFFERENTIAL/PLATELET  URINALYSIS, ROUTINE W REFLEX MICROSCOPIC    EKG None  Radiology No results found.  Procedures Procedures (including critical care time)  Medications Ordered in ED Medications - No data to display  ED Course  I have reviewed the triage vital signs and the nursing notes.  Pertinent labs & imaging results that were  available during my care of the patient were reviewed by me and considered in my medical decision making (see chart for details).    MDM Rules/Calculators/A&P                      63yM with mania. Little sleep. Erratic behavior. Paranoid thoughts.   He is extremely logical and concrete in his thought process to the point of not allowing for any nuance of flexibility whatsoever. He interprets any variation as people lying to him and working against him. He views someone as an advocate for him if they agree with him. If they don't he views them as an adversary.  I don't feel good about it but I think it's the best course of action is to involuntarily commit him for psychiatric stablization.   Will obtain labs and UA for medical clearance. Baseline EKG. Care signed out to Dr Daniel Shannon at  change of shift with labs pending.   Final Clinical Impression(s) / ED Diagnoses Final diagnoses:  Mania [facility])    Rx / DC Orders ED Discharge Orders    None       Virgel Manifold, MD 05/27/19 [phone]

## 2019-05-27 NOTE — Discharge Summary (Signed)
  Patient to be transported to [facility] for inpatient psychiatric treatment.

## 2019-05-27 NOTE — ED Notes (Addendum)
Attempted to get pt to change into scrubs. Pt is focused on "trying to get out of here." Pt attempted to bribe this Probation officer with money to help get pt out of hospital. Pt became upset with this Probation officer when he was told I could not help him leave the hospital. Pt then kept stating "you lied to me. I can not trust you. Everyone has lied to me all day. You lose the game." I tried to explain to pt that I did not lie to him and that I was trying to explain the process to him. Pt unable to be redirected at this time. RN and MD at bedside.

## 2019-05-27 NOTE — ED Provider Notes (Signed)
9:41 AM Secretary just came into the room to ask if the patient could be given a pen and paper.  I open the chart in order to see if the patient could safely be given a writing implement.  After a discussion with the rest of the treating team, we felt that a crayon was appropriate to minimize danger to self initially.  Will defer further decision making to the psychiatry team who is treating him currently.   Tylin Stradley, Gwenyth Allegra, MD 05/27/19 [phone]

## 2019-05-27 NOTE — Progress Notes (Addendum)
[facility] Second Physician Opinion Progress Note for Medication Administration to Non-consenting Patients (For Involuntarily Committed Patients)  Patient: Daniel Shannon Date of Birth: [id number] MRN: [id number]  Reason for the Medication: The patient, without the benefit of the specific treatment measure, is incapable of participating in any available treatment plan that will give the patient a realistic opportunity of improving the patient's condition.  Consideration of Side Effects: Consideration of the side effects related to the medication plan has been given.  Rationale for Medication Administration: Dr. Jake Samples, Attending Psychiatrist, has asked me to evaluate patient for medication administration over objection.  64 year old male, no prior psychiatric history, presenting with acute manic symptoms Patient is presenting with disorganized behaviors, poor sleep. Presents with  pressured speech and frequent interruptions ,some psychomotor restlessness, irritability. He appears paranoid and is noted to be writing and drawing hard to follow diagrams during assessment. Limited insight at this time.      Jenne Campus, MD 05/27/19  2:43 PM   This documentation is good for (7) seven days from the date of the MD signature. New documentation must be completed every seven (7) days with detailed justification in the medical record if the patient requires continued non-emergent administration of psychotropic medications.

## 2019-05-27 NOTE — ED Notes (Signed)
Pt is manic in conversation and will not allow writer to speak or complete a sentence. He thinks everyone is lying to him. Dr Fanny Skates is present, writer trying to explain he is not allowed visitors and will have to leave, due to manic state of pt unable to complete goal. At present time Waunita Schooner TTS is in room with pt.

## 2019-05-27 NOTE — ED Provider Notes (Signed)
I assumed care of this patient from Dr. Wilson Singer.  Please see their note for further details of Hx, PE.  Briefly patient is a 64 y.o. male who presented acute psychosis. Patient IVC'd. Pending medical clearance. Plan to follow up labs.    Labs with mild AKI, otherwise reassuring. Cleared for [facility] evaluation.   Fatima Blank, MD 05/27/19 [phone]

## 2019-05-27 NOTE — ED Notes (Signed)
Pt ambulates with a steady gait and is escorted out by [employer]

## 2019-05-27 NOTE — ED Notes (Signed)
Pt requesting something to write with. He was provided with request. Upon entering the room, pt sitting in chair with paper and pen. He is making some type of notes, He requested information as to what is happening and why he is here. States he come in willingly and was told he would be able to go back home. He later was IVC'd and he does not know why or what his diagnosis is. He is aware he was sleep deprived and just needed to sleep, has not been sleeping for several days and nights due to family concerns. He is very upset due to the meds given and the fact he had no pillow or bed to sleep in, was on stretcher all night. "I went out and got my own blanket" No one has listened to me since I come in. Family told him they would take him back home, was not allowed to say good bye to them. He requested to call his wife. He was made aware of phone call policy. He requested Probation officer dial the number and make sure [name] (his wife) was willing to speak with him. This was completed and she did want to speak with him. During conversation, Probation officer stepped out and obtained hospital bed for him, received phone back.

## 2019-05-27 NOTE — ED Notes (Addendum)
Sheriff at bedside attempting to transport pt to [facility]. Pt is delaying transport. Pt asking for paper and asking for everyone's name. Pt continually stating "Hold on! Hold On! I need to know your name. I have not had a representative. I have been lied to. I want my wife with me" Pt is attempting to document everything that others are saying to him. Pt is maniac and not redirectable at this time. [employer] is calmly  attempting allow pt to voluntary walk to transport vehicle. Pt is not cooperative. Pt is stating "They made me put on clothes I dont like. They wont let me see my wife. No one cares about me. I JUST need sleep. I am not crazy. They put me in a room with just a stretcher. No one cares about me" Staff is attempting to work with pt. Pt refusing vitals at time of transport.

## 2019-05-27 NOTE — Tx Team (Signed)
Initial Treatment Plan 05/27/2019 3:20 PM Daniel Shannon [id number]    PATIENT STRESSORS: Health problems Occupational concerns Traumatic event   PATIENT STRENGTHS: Average or above average intelligence Capable of independent living Communication skills Financial means Motivation for treatment/growth Physical Health   PATIENT IDENTIFIED PROBLEMS: anxiety  hyperactivity  anger  insomnia               DISCHARGE CRITERIA:  Ability to meet basic life and health needs Improved stabilization in mood, thinking, and/or behavior Motivation to continue treatment in a less acute level of care Need for constant or close observation no longer present  PRELIMINARY DISCHARGE PLAN: Attend aftercare/continuing care group Return to previous living arrangement Return to previous work or school arrangements  PATIENT/FAMILY INVOLVEMENT: This treatment plan has been presented to and reviewed with the patient, Daniel Shannon.  The patient and family have been given the opportunity to ask questions and make suggestions.  Baron Sane, RN 05/27/2019, 3:20 PM

## 2019-05-27 NOTE — Progress Notes (Signed)
Writer contacted patient's wife, upon patient's request to inform her of his arrival to [facility].  Patient was noted to have pressured speech, was documenting times of what was happening during the admissions process.

## 2019-05-27 NOTE — ED Notes (Signed)
Report given to [place] room 300

## 2019-05-27 NOTE — ED Notes (Signed)
Pt refuses VS to be obtained at this time, is very paranoid and delusional believing that staff is "out to get him".

## 2019-05-27 NOTE — BH Assessment (Signed)
Assessment Note  Daniel Shannon is an 64 y.o. male that presents this date with IVC due to acute psychosis. Per IVC, Patient has been exhibiting manic behaviors and hasn't been sleeping. Patient is observed to be paranoid. He went to the home of the hospital CEO last night unannounced. He did this because he was paranoid about his phone "pinging" and he thought he needed to discuss this with CEO despite not actually knowing him. Patient is observed to be tangential and renders limited history as this Probation officer attempts to assess. Patient per notes by EDP Kohut MD on arrival, states patient was brought in by his wife earlier for a psychiatric evaluation. 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 Patient has had paranoid thoughts which led him to go the home of the hospital CEO unannounced prior to arrival. Patient is observed to be very agitated as this Probation officer attempts to assess. Patient is refusing to participate in the assessment and is stating he will not answer any questions associated with his current mental health condition without the interview being recorded. Patient is observed to be writing everything down and is asking for random names of individuals he feels are associated with his care. Patient voices "everyone is after him" although will not elaborate on content of statement. Patient continues to offer this Probation officer financial compensation to "tell him the truth." This Probation officer attempts to redirect patient unsuccessfully. Patient will talk at length on various unrelated topics informing this writer he will answer any questions if I will go get "two five dollar bills." When this writer informs patient that is not possible patient states he will "answer all relevant questions." Patient denies any S/I,H/I or AVH. Patient denies any prior attempts or gestures at self harm Patient denies any past or  current SA issues. Patient denies any prior mental health history although this writer is uncertain if patient is comprehending the content of this writer's questions. Patient is observed to be hitting his bed with his hand and yelling "no, no, no, to everything you ask." This writer terminates interview as patient is observed to become highly agitated and is asking this Probation officer to "leave the room immediately." Patient declines to give consent to "speak to anyone" in reference to any collateral information. History is limited per chart review. To note RN Lacinda Axon documented that Nicolasa Ducking MD prescribed patient new medication yesterday. See MAR. Patientpresents with agitated affect and speaks in a loud voice with pressured speech. Patient will not answer orientation questions although states "I know where I am."  Patient'smood was angry and anxious. Patient'saffect was congruent with mood. Patient'sthought process was tangential and patient was observed to be in a highly manic state. Patient'sjudgement was impaired. Patient'sconcentration was poor as patient displayed some paranoia. Patient'sinsight and impulse control are poor. Patient did not appear to be responding to internal stimuli.Case was staffed with Dwyane Dee MD who recommended a inpatient admission to assist with stabilization.          Diagnosis: Unspecified psychosis.   Past Medical History: No past medical history on file.   Family History: No family history on file.  Social History:  has no history on file for tobacco, alcohol, and drug.  Additional Social History:  Alcohol / Drug Use Pain Medications: See MAR Prescriptions: See MAR Over the Counter: See MAR History of alcohol / drug use?: No history of alcohol / drug abuse  CIWA:   COWS:    Allergies: No Known Allergies  Home Medications:  Medications Prior to Admission  Medication Sig Dispense Refill  . cholecalciferol (VITAMIN D3) 25 MCG (1000 UNIT) tablet Take 1,000 Units by  mouth daily.    . divalproex (DEPAKOTE ER) 500 MG 24 hr tablet Take 1,000 mg by mouth at bedtime.    Marland Kitchen QUEtiapine (SEROQUEL) 50 MG tablet Take 50 mg by mouth at bedtime.      OB/GYN Status:  No LMP for male patient.  General Assessment Data Location of Assessment: [facility] ED TTS Assessment: In system Is this a Tele or Face-to-Face Assessment?: Face-to-Face Is this an Initial Assessment or a Re-assessment for this encounter?: Initial Assessment Patient Accompanied by:: N/A Language Other than English: No Living Arrangements: Other (Comment) What gender do you identify as?: Male Marital status: Married Living Arrangements: Spouse/significant other Can pt return to current living arrangement?: Yes Admission Status: Involuntary Petitioner: ED Attending Is patient capable of signing voluntary admission?: No Referral Source: Self/Family/Friend Insurance type: Systems developer Exam ([place]) Medical Exam completed: Yes  Crisis Care Plan Living Arrangements: Spouse/significant other Legal Guardian: (NA) Name of Psychiatrist: Pt denies Name of Therapist: None  Education Status Is patient currently in school?: No Is the patient employed, unemployed or receiving disability?: Employed  Risk to self with the past 6 months Suicidal Ideation: No Has patient been a risk to self within the past 6 months prior to admission? : No Suicidal Intent: No Has patient had any suicidal intent within the past 6 months prior to admission? : No Is patient at risk for suicide?: No, but patient needs Medical Clearance Suicidal Plan?: No Has patient had any suicidal plan within the past 6 months prior to admission? : No Access to Means: No What has been your use of drugs/alcohol within the last 12 months?: NA Previous Attempts/Gestures: No How many times?: 0 Other Self Harm Risks: (Current MH symptoms) Triggers for Past Attempts: (NA) Intentional Self Injurious Behavior: None Family  Suicide History: No Recent stressful life event(s): Other (Comment)(Frequent travel associated with family member) Persecutory voices/beliefs?: No Depression: No(Pt denies) Depression Symptoms: (Pt denies) Substance abuse history and/or treatment for substance abuse?: No Suicide prevention information given to non-admitted patients: Not applicable  Risk to Others within the past 6 months Homicidal Ideation: No Does patient have any lifetime risk of violence toward others beyond the six months prior to admission? : No Thoughts of Harm to Others: No Current Homicidal Intent: No Current Homicidal Plan: No Access to Homicidal Means: No Identified Victim: NA History of harm to others?: No Assessment of Violence: None Noted Violent Behavior Description: NA Does patient have access to weapons?: No Criminal Charges Pending?: No Does patient have a court date: No Is patient on probation?: No  Psychosis Hallucinations: None noted Delusions: None noted  Mental Status Report Appearance/Hygiene: In scrubs Eye Contact: Poor Motor Activity: Agitation, Restlessness Speech: Pressured, Loud Level of Consciousness: Irritable Mood: Anxious Affect: Angry Anxiety Level: Severe Thought Processes: Tangential Judgement: Impaired Orientation: Unable to assess Obsessive Compulsive Thoughts/Behaviors: None  Cognitive Functioning Concentration: Decreased Memory: Unable to Assess Is patient IDD: No Insight: Unable to Assess Impulse Control: Unable to Assess Appetite: (UTA) Have you had any weight changes? : (UTA) Sleep: (UTA) Total Hours of Sleep: (UTA) Vegetative Symptoms: Unable to Assess  ADLScreening [facility] Assessment Services) Patient's cognitive ability adequate to safely complete daily activities?: Yes Patient able to express need for assistance with ADLs?: Yes  Independently performs ADLs?: Yes (appropriate for developmental age)  Prior Inpatient Therapy Prior Inpatient Therapy:  No(Per patient)  Prior Outpatient Therapy Prior Outpatient Therapy: No(Per patient) Does patient have an ACCT team?: No Does patient have Intensive In-House Services?  : No Does patient have [facility] services? : No Does patient have P4CC services?: No  ADL Screening (condition at time of admission) Patient's cognitive ability adequate to safely complete daily activities?: Yes Is the patient deaf or have difficulty hearing?: No Does the patient have difficulty seeing, even when wearing glasses/contacts?: No Does the patient have difficulty concentrating, remembering, or making decisions?: No Patient able to express need for assistance with ADLs?: Yes Does the patient have difficulty dressing or bathing?: No Independently performs ADLs?: Yes (appropriate for developmental age) Does the patient have difficulty walking or climbing stairs?: No Weakness of Legs: None Weakness of Arms/Hands: None  Home Assistive Devices/Equipment Home Assistive Devices/Equipment: None  Therapy Consults (therapy consults require a physician order) PT Evaluation Needed: No OT Evalulation Needed: No SLP Evaluation Needed: No Abuse/Neglect Assessment (Assessment to be complete while patient is alone) Abuse/Neglect Assessment Can Be Completed: Yes Physical Abuse: Denies Verbal Abuse: Denies Sexual Abuse: Denies Exploitation of patient/patient's resources: Denies Self-Neglect: Denies Values / Beliefs Cultural Requests During Hospitalization: None Spiritual Requests During Hospitalization: None Consults Spiritual Care Consult Needed: No Transition of Care Team Consult Needed: No Advance Directives (For Healthcare) Does Patient Have a Medical Advance Directive?: No Would patient like information on creating a medical advance directive?: No - Patient declined          Disposition: Case was staffed with Dwyane Dee MD who recommended a inpatient admission to assist with stabilization.           Disposition Initial Assessment Completed for this Encounter: Yes Disposition of Patient: Admit Type of inpatient treatment program: Adult  On Site Evaluation by:   Reviewed with Physician:    Mamie Nick 05/27/2019 1:15 PM

## 2019-05-27 NOTE — ED Notes (Signed)
Sitter to pt bedside at this time.

## 2019-05-28 MED ORDER — TEMAZEPAM 30 MG PO CAPS
30.0000 mg | ORAL_CAPSULE | Freq: Every evening | ORAL | Status: DC | PRN
  Filled 2019-05-28: qty 1

## 2019-05-28 MED ORDER — DIVALPROEX SODIUM 500 MG PO DR TAB
500.0000 mg | DELAYED_RELEASE_TABLET | Freq: Three times a day (TID) | ORAL | Status: DC
  Administered 2019-05-28 – 2019-05-31 (×7): 500 mg via ORAL
  Filled 2019-05-28 (×16): qty 1

## 2019-05-28 MED ORDER — OLANZAPINE 10 MG PO TBDP
20.0000 mg | ORAL_TABLET | Freq: Every day | ORAL | Status: DC
  Administered 2019-05-28 – 2019-05-30 (×3): 20 mg via ORAL
  Filled 2019-05-28 (×6): qty 2

## 2019-05-28 MED ORDER — CLONIDINE HCL 0.1 MG PO TABS: 0.1000 mg | ORAL_TABLET | ORAL | Status: DC | PRN

## 2019-05-28 MED ORDER — OLANZAPINE 10 MG PO TABS
10.0000 mg | ORAL_TABLET | Freq: Every day | ORAL | Status: DC
  Administered 2019-05-29: 10 mg via ORAL
  Filled 2019-05-28 (×3): qty 1

## 2019-05-28 NOTE — Progress Notes (Signed)
Recreation Therapy Notes  Date: 2.9.21 Time: 0940 Location: [address] Dayroom  Group Topic:  Goal Setting  Goal Area(s) Addresses:  Patient will identify goals they wish to set. Patient will identify benefit of setting goals. Patient will identify benefit of pursuing goals post d/c.  Intervention: Worksheet  Activity:  Lobbyist.  Patient will identify goals they wish to accomplish in week, month, year and 5 years.  Patient then identifies any obstacles to reaching goals, what they need to achieve goals and what they can start doing now to work toward goals.   Education:  Discharge Planning, Coping Skills, Goal Planning  Education Outcome: Acknowledges Education/In Group Clarification Provided/Needs Additional Education  Clinical Observations: Pt did not attend group session.     Victorino Sparrow , LRT/CTRS         Ria Comment, Teruko Joswick A 05/28/2019 11:03 AM

## 2019-05-28 NOTE — Progress Notes (Signed)
Patient ID: Daniel Shannon, male   DOB: [birth year], 64 y.o.   MRN: [id number]   CSW arrived to patient's room at 3:15pm on 05/27/2019 with another CSW to observe.   CSW asked patient if CSW could complete the assessment for the patient. Patient began to have pressure both CSWs about not being heard, wanting to go home, see his wife, and get Dr. Jake Shannon to listen to him.  Patient asked both CSWs TWICE if he could pay them to let him leave. Patient also stated that he would give the CSWs 300 dollars for them to go to his home, stay in his extra bedrooms, and watch him so he can sleep in his own bed. CSW explained that was inappropriate and would not happen because it is not allowed. Patient asked CSW if she could stay past 4:30pm and he pay her what [facility] pays her for her hour to talk with him. CSW stated "no" and reinforced that is inappropriate and would not happen.   CSW told patient multiple times that she has another meeting that she has to attend. Patient continued to talk over CSW and not let either CSW leave. Patient had pressured speech, manic, and would stop CSWs whenever they talked so that he could write down what they stated and the time. Patient stated, "If you do not know....state that you do not know".   CSW gave information on how the patient can call his wife, why he cannot have his belongings, and that he will be staying here overnight.  Patient stated that he just wants to get home to his own bed with his wife and that he just needs sleep. Patient stated that he does not need medication and he does not want to be forced medications. Patient stated, "time is money and nobody gives me time around here except for Daniel Shannon the nurse".   Patient asked if CSW could get a meeting with Daniel Shannon, the nurse, Dr. Jake Shannon and himself so that he can express to Dr. Jake Shannon about what is going on and prove that Daniel Shannon the nurse has more knowledge and spends more time with him.    CSW was ONLY able to complete two  questions on the assessment because the patient refused to do it because he wanted CSWs to listen to him. CSW spent 1 hour with the patient. CSW expressed that she will need to do the assessment with the patient on Tuesday (the next day) and will give the patient at least an hour, so that he feels heard. Patient asked to keep the paperwork that he will sign so that he can analyze it. CSW stated that it is against protocol. Patient asked for an "exception". CSW explained that she could not and would go over it during the assessment. Patient stated, "You going to need more than an hour with me because I am going to ask you about every single line".   CSW left the room at 4:15pm, as CSW missed her meeting.

## 2019-05-28 NOTE — Progress Notes (Signed)
Recreation Therapy Notes  2.9.21  LRT could not complete patient recreation therapy assessment because patient is manic and unable to focus.  LRT will attempt to complete assessment at a later time.   Victorino Sparrow, LRT/CTRS     Ria Comment, Khizar Fiorella A 05/28/2019 11:04 AM

## 2019-05-28 NOTE — Progress Notes (Signed)
   05/28/19 0630  Psych Admission Type (Psych Patients Only)  Admission Status Involuntary  Psychosocial Assessment  Patient Complaints Anxiety;Hyperactivity;Crying spells;Restlessness;Insomnia  Eye Contact Fair  Facial Expression Animated;Anxious;Pensive  Affect Anxious;Preoccupied;Apprehensive  Speech Logical/coherent;Pressured;Loud;Rapid  Interaction Assertive;Dominating  Motor Activity Fidgety;Restless  Appearance/Hygiene In scrubs;Unremarkable  Behavior Characteristics Cooperative;Agressive verbally  Mood Depressed;Anxious;Suspicious  Thought Process  Coherency Disorganized;Flight of ideas;Tangential;Loose associations  Content Blaming others;Paranoia;Preoccupation  Delusions Paranoid;Persecutory  Perception WDL  Hallucination None reported or observed  Judgment Poor  Confusion Mild  Danger to Self  Current suicidal ideation? Denies  Danger to Others  Danger to Others None reported or observed   Pt denies SI, HI, AVH and pain. Pt very manic this morning, writing everything down, rapid pressured speech. Pt paranoid and monopolizing time with this Probation officer.

## 2019-05-28 NOTE — Progress Notes (Signed)
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  He was seen in the day room when he was taking medications, I explained basically the benefits of the medication and then he was encouraged to come to the office for an interview when he was done with medications and had gone to the restroom  He continues to insist upon controlling interviews, insist upon discharge, writing everything down.  He continues to insist that people are not listening to him and not helping him.  He reports that he will have his case "figured out in 1/2-hour".  Further, the patient is again nearly impossible to interview in a legitimate fashion he interrupts continually.  I asked him to at least give me 5 minutes where he did not interrupt and allowed me to ask questions and he answered questions and of course this just leads to more argumentative statements. Patient rambles/rapidly states "it all sounds wierd to you, you think I am manic, you think I have pressured speech, that maybe my thoughts are racing actually my thoughts are slower than they normally are... It is 11:00 right?  I ask one thing first, if you spoken to my wife,?  Her name is what?  How long did you speak to her?  You had my permission to speak with her, right?" I tried repeatedly to redirect the patient and answer basic questions of the mental status exam.  He responds "my wife was upset after talking with her-she said you were on the phone with her for an hour-we have equal time here at the more you asked the more time I lose... You are honest about that?  My 3 principles are get Shanon Brow and home in bed without  medicine... I do not want medicine-if the hospital did something that countermands your order which you acknowledge the medicines were not helping?  You have committed me yesterday after 30 minutes-that question you ask leads to another question, usually I do not have to explain to people. you are the enemy, if you do not let me leave and go to my own bed, you are the enemy"  As above patient continually interrupts and makes these statements, argues for discharge so forth-  Principal Problem: New onset mania in the context of recent insomnia and psychosocial stress without prior history/family history of bipolar in sister Diagnosis: Active Problems:   Bipolar disorder ([place])  Total Time spent with patient: 20 minutes  Past Psychiatric History:   Past Medical History: History reviewed. No pertinent past medical history. History reviewed. No pertinent surgical history. Family History: History reviewed. No pertinent family history. Family Psychiatric  History: Sister bipolar brother depression Social History:  Social History   Substance and Sexual Activity  Alcohol Use Not Currently     Social History   Substance and Sexual Activity  Drug Use Not Currently    Social History   Socioeconomic History  . Marital status: Married    Spouse name: Not on file  . Number of children: Not on file  . Years of education: Not on file  . Highest education level: Not on file  Occupational History  .  Not on file  Tobacco Use  . Smoking status: Never Smoker  . Smokeless tobacco: Never Used  Substance and Sexual Activity  . Alcohol use: Not Currently  . Drug use: Not Currently  . Sexual activity: Yes  Other Topics Concern  . Not on file  Social History Narrative  . Not on file   Social Determinants of Health   Financial Resource Strain:   . Difficulty of Paying Living Expenses: Not on file  Food Insecurity:   . Worried About Charity fundraiser in the Last Year: Not on file  . Ran Out  of Food in the Last Year: Not on file  Transportation Needs:   . Lack of Transportation (Medical): Not on file  . Lack of Transportation (Non-Medical): Not on file  Physical Activity:   . Days of Exercise per Week: Not on file  . Minutes of Exercise per Session: Not on file  Stress:   . Feeling of Stress : Not on file  Social Connections:   . Frequency of Communication with Friends and Family: Not on file  . Frequency of Social Gatherings with Friends and Family: Not on file  . Attends Religious Services: Not on file  . Active Member of Clubs or Organizations: Not on file  . Attends Archivist Meetings: Not on file  . Marital Status: Not on file   Additional Social History:    Pain Medications: See MAR Prescriptions: See MAR Over the Counter: See MAR History of alcohol / drug use?: No history of alcohol / drug abuse                    Sleep: Poor charted at less than 5 hours states he was awakened every 15 minutes by status checks  Appetite:  Fair  Current Medications: Current Facility-Administered Medications  Medication Dose Route Frequency Provider Last Rate Last Admin  . cloNIDine (CATAPRES) tablet 0.1 mg  0.1 mg Oral Q4H PRN Johnn Hai, MD      . divalproex (DEPAKOTE) DR tablet 500 mg  500 mg Oral Q8H Johnn Hai, MD      . LORazepam (ATIVAN) tablet 1 mg  1 mg Oral Q4H PRN Cobos, Myer Peer, MD   1 mg at 05/28/19 1046  . OLANZapine (ZYPREXA) injection 10 mg  10 mg Intramuscular TID PRN Johnn Hai, MD      . Derrill Memo ON 05/29/2019] OLANZapine (ZYPREXA) tablet 10 mg  10 mg Oral Daily Johnn Hai, MD      . OLANZapine zydis (ZYPREXA) disintegrating tablet 20 mg  20 mg Oral QHS Johnn Hai, MD      . temazepam (RESTORIL) capsule 30 mg  30 mg Oral QHS PRN Johnn Hai, MD        Lab Results:  Results for orders placed or performed during the hospital encounter of 05/27/19 (from the past 48 hour(s))  Comprehensive metabolic panel     Status: Abnormal    Collection Time: 05/27/19  1:00 AM  Result Value Ref Range   Sodium 137 135 - 145 mmol/L   Potassium 3.9 3.5 - 5.1 mmol/L   Chloride 105 98 - 111 mmol/L   CO2 21 (L) 22 - 32 mmol/L   Glucose, Bld 133 (H) 70 - 99 mg/dL   BUN 21 8 - 23 mg/dL   Creatinine, Ser 1.29 (H) 0.61 - 1.24 mg/dL   Calcium 9.0 8.9 - 10.3 mg/dL   Total Protein 7.4 6.5 - 8.1 g/dL  Albumin 4.5 3.5 - 5.0 g/dL   AST 38 15 - 41 U/L   ALT 33 0 - 44 U/L   Alkaline Phosphatase 64 38 - 126 U/L   Total Bilirubin 1.6 (H) 0.3 - 1.2 mg/dL   GFR calc non Af Amer 58 (L) >60 mL/min   GFR calc Af Amer >60 >60 mL/min   Anion gap 11 5 - 15    Comment: Performed at [facility], [place] [address] [place], [place] [address]  Ethanol     Status: None   Collection Time: 05/27/19  1:00 AM  Result Value Ref Range   Alcohol, Ethyl (B) <10 <10 mg/dL    Comment: (NOTE) Lowest detectable limit for serum alcohol is 10 mg/dL. For medical purposes only. Performed at [facility], [place] [address], [place], [place] [address]   CBC with Diff     Status: Abnormal   Collection Time: 05/27/19  1:00 AM  Result Value Ref Range   WBC 11.4 (H) 4.0 - 10.5 K/uL   RBC 4.55 4.22 - 5.81 MIL/uL   Hemoglobin 14.6 13.0 - 17.0 g/dL   HCT 43.0 39.0 - 52.0 %   MCV 94.5 80.0 - 100.0 fL   MCH 32.1 26.0 - 34.0 pg   MCHC 34.0 30.0 - 36.0 g/dL   RDW 12.1 11.5 - 15.5 %   Platelets 207 150 - 400 K/uL   nRBC 0.0 0.0 - 0.2 %   Neutrophils Relative % 62 %   Neutro Abs 7.0 1.7 - 7.7 K/uL   Lymphocytes Relative 30 %   Lymphs Abs 3.4 0.7 - 4.0 K/uL   Monocytes Relative 8 %   Monocytes Absolute 0.9 0.1 - 1.0 K/uL   Eosinophils Relative 0 %   Eosinophils Absolute 0.0 0.0 - 0.5 K/uL   Basophils Relative 0 %   Basophils Absolute 0.1 0.0 - 0.1 K/uL   Immature Granulocytes 0 %   Abs Immature Granulocytes 0.03 0.00 - 0.07 K/uL    Comment: Performed at [facility], [place] [address] [place], [place] [address]   Valproic acid level     Status: Abnormal   Collection Time: 05/27/19  1:00 AM  Result Value Ref Range   Valproic Acid Lvl 15 (L) 50.0 - 100.0 ug/mL    Comment: Performed at [facility], [place] [address] [place], [place] [address]  Respiratory Panel by RT PCR (Flu A&B, Covid) - Nasopharyngeal Swab     Status: None   Collection Time: 05/27/19  1:05 AM   Specimen: Nasopharyngeal Swab  Result Value Ref Range   SARS Coronavirus 2 by RT PCR NEGATIVE NEGATIVE    Comment: (NOTE) SARS-CoV-2 target nucleic acids are NOT DETECTED. The SARS-CoV-2 RNA is generally detectable in upper respiratoy specimens during the acute phase of infection. The lowest concentration of SARS-CoV-2 viral copies this assay can detect is 131 copies/mL. A negative result does not preclude SARS-Cov-2 infection and should not be used as the sole basis for treatment or other patient management decisions. A negative result may occur with  improper specimen collection/handling, submission of specimen other than nasopharyngeal swab, presence of viral mutation(s) within the areas targeted by this assay, and inadequate number of viral copies (<131 copies/mL). A negative result must be combined with clinical observations, patient history, and epidemiological information. The expected result is Negative. Fact Sheet for Patients:  PinkCheek.be Fact Sheet for Healthcare Providers:  GravelBags.it This test is not yet ap proved or  cleared by the [place] and  has been authorized for detection and/or diagnosis of SARS-CoV-2 by FDA under an Emergency Use Authorization (EUA). This EUA will remain  in effect (meaning this test can be used) for the duration of the COVID-19 declaration under Section 564(b)(1) of the Act, 21 U.S.C. section 360bbb-3(b)(1), unless the authorization is terminated or revoked sooner.    Influenza A by PCR NEGATIVE NEGATIVE    Influenza B by PCR NEGATIVE NEGATIVE    Comment: (NOTE) The Xpert Xpress SARS-CoV-2/FLU/RSV assay is intended as an aid in  the diagnosis of influenza from Nasopharyngeal swab specimens and  should not be used as a sole basis for treatment. Nasal washings and  aspirates are unacceptable for Xpert Xpress SARS-CoV-2/FLU/RSV  testing. Fact Sheet for Patients: PinkCheek.be Fact Sheet for Healthcare Providers: GravelBags.it This test is not yet approved or cleared by the [place] FDA and  has been authorized for detection and/or diagnosis of SARS-CoV-2 by  FDA under an Emergency Use Authorization (EUA). This EUA will remain  in effect (meaning this test can be used) for the duration of the  Covid-19 declaration under Section 564(b)(1) of the Act, 21  U.S.C. section 360bbb-3(b)(1), unless the authorization is  terminated or revoked. Performed at [facility], [place] [address] [place],  [address]     Blood Alcohol level:  Lab Results  Component Value Date   ETH <10 [id number]    Metabolic Disorder Labs: No results found for: HGBA1C, MPG No results found for: PROLACTIN No results found for: CHOL, TRIG, HDL, CHOLHDL, VLDL, LDLCALC  Physical Findings: AIMS: Facial and Oral Movements Muscles of Facial Expression: None, normal Lips and Perioral Area: None, normal Jaw: None, normal Tongue: None, normal,Extremity Movements Upper (arms, wrists, hands, fingers): None, normal Lower (legs, knees, ankles, toes): None, normal, Trunk Movements Neck, shoulders, hips: None, normal, Overall Severity Severity of abnormal movements (highest score from questions above): None, normal Incapacitation due to abnormal movements: None, normal Patient's awareness of abnormal movements (rate only patient's report): No Awareness, Dental Status Current problems with teeth and/or dentures?: No Does patient usually wear  dentures?: No  CIWA:    COWS:     Musculoskeletal: Strength & Muscle Tone: within normal limits Gait & Station: normal Patient leans: N/A  Psychiatric Specialty Exam: Physical Exam  Review of Systems  Blood pressure (!) 162/93, pulse 80, temperature 98 F (36.7 C), temperature source Oral, resp. rate 18, height 6\' 1"  (1.854 m), weight 86.2 kg, SpO2 98 %.Body mass index is 25.07 kg/m.  General Appearance: Casual  Eye Contact:  Good  Speech:  Clear and Coherent and Pressured  Volume:  Normal  Mood:  Manic  Affect:  Constricted  Thought Process:  Irrelevant and Descriptions of Associations: Loose  Orientation:  Full (Time, Place, and Person)  Thought Content:  Clearly obsessive about discharge obsessive about not taking medications -jumping from topic to topic and thoughts are clearly racing though he denied this specifically.  Though no formed paranoid delusions continues to consider people his "advocate" or the enemy.  Anything short of immediate discharge makes caregivers his "enemy"  Suicidal Thoughts:  No  Homicidal Thoughts:  No  Memory:  Immediate;   Good Recent;   Good Remote;   Good  Judgement:  Impaired  Insight:  Lacking  Psychomotor Activity:  Normal  Concentration:  Concentration: Fair and Attention Span: Poor  Recall:  Good  Fund of Knowledge:  Good  Language:  Pressured and  talking pretty much nonstop, it is very difficult for me to finish 1 sentence of the interview questions before being interrupted  Akathisia:  Negative  Handed:  Right  AIMS (if indicated):     Assets:  Agricultural consultant Housing Intimacy Physical Health Resilience Social Support Talents/Skills Transportation Vocational/Educational  ADL's:  Intact  Cognition:  WNL  Sleep:  Number of Hours: 4.5   Treatment Plan Summary: Daily contact with patient to assess and evaluate symptoms and progress in treatment and Medication management  Interview was ended  as we simply were not getting anywhere of the patient's state of mind is just not conducive to gaining insight at this point in time.  I encouraged him to take his medications. We will escalate Depakote to 500 mg 3 times daily and encourage compliance Change olanzapine to 10 mg the morning and 20 at bedtime to encourage sleep Order as needed Catapres for hypertension patient agreed that he would take it only at his request not at parameters of blood pressure Continue temazepam as needed patient encouraged to take it I told him that he would simply not improve without sleep Plan MRI at some point but patient does not be trusted to leave the unit at this point in time Continue current precautions Consider discontinuing phone privileges or cutting off phone if he continues to call his wife and this becomes more agitating for him  Johnn Hai, MD 05/28/2019, 11:13 AM

## 2019-05-29 LAB — TSH: TSH: 1.103 u[IU]/mL (ref 0.350–4.500)

## 2019-05-29 MED ORDER — OLANZAPINE 10 MG PO TBDP
10.0000 mg | ORAL_TABLET | Freq: Every day | ORAL | Status: DC
  Administered 2019-05-30 – 2019-05-31 (×2): 10 mg via ORAL
  Filled 2019-05-29 (×4): qty 1

## 2019-05-29 MED ORDER — TEMAZEPAM 15 MG PO CAPS
45.0000 mg | ORAL_CAPSULE | Freq: Every day | ORAL | Status: DC
  Administered 2019-05-29 – 2019-05-30 (×2): 45 mg via ORAL
  Filled 2019-05-29 (×2): qty 3

## 2019-05-29 MED ORDER — LORAZEPAM 1 MG PO TABS
1.0000 mg | ORAL_TABLET | Freq: Two times a day (BID) | ORAL | Status: DC
  Administered 2019-05-30 – 2019-05-31 (×3): 1 mg via ORAL
  Filled 2019-05-29 (×2): qty 1
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
## 2019-05-29 NOTE — Progress Notes (Signed)
Pt very controversial , pt wants to dictate his care. Pt very disorganized in his thinking. Pt continues to wright things down, but everything is scattered. Pt wants to continue to discuss things not relevant to his care at this time. Pt tries to manipulate every situation involved. Pt was informed that he only slept 3.75 hrs last night and pt stated he slept 7 hrs and he could prove it.

## 2019-05-29 NOTE — Progress Notes (Addendum)
   05/28/19 2130  Psych Admission Type (Psych Patients Only)  Admission Status Involuntary  Psychosocial Assessment  Patient Complaints Anxiety;Depression;Hyperactivity;Restlessness;Irritability  Eye Contact Fair  Facial Expression Animated;Anxious;Worried  Affect Anxious;Preoccupied;Apprehensive;Irritable  Speech Logical/coherent;Pressured;Loud;Rapid;Tangential  Interaction Assertive;Dominating  Motor Activity Fidgety;Restless  Appearance/Hygiene In scrubs;Unremarkable  Behavior Characteristics Agressive verbally;Anxious;Cooperative  Mood Anxious;Labile;Irritable;Preoccupied  Aggressive Behavior  Effect No apparent injury  Thought Process  Coherency Disorganized;Flight of ideas;Tangential;Loose associations  Content Blaming others;Paranoia;Preoccupation  Delusions Paranoid;Persecutory  Perception WDL  Hallucination None reported or observed  Judgment Poor  Confusion Mild  Danger to Self  Current suicidal ideation? Denies  Danger to Others  Danger to Others None reported or observed   Pt seen at med window. Pt denies SI, HI, AVH and pain. Pt states he will become increasingly more depressed the longer he has to stay at [facility]. Pt believes that Dr. Jake Samples is his enemy since he will not allow him to be discharged. Pt is writing everything down. Pt takes approximately 45 minutes to take his Depakote 500 mg and Zyprexa 20 mg. Pt states "I do not need this medication and I am taking it against my will." Pt wants this writer to call the doctor and confirm the medications and their possible side effects on him before he will take them. Pt told that doctor has already confirmed that and there is no need for a call. Pt told that if he refuses medication then he will be given an injection. Pt still writes his objections to meds but eventually takes medications after writing down the markings on the pills and tracing them in his notebook. Pt refused Restoril 30 mg PRN for sleep since it wasn't  scheduled and required.

## 2019-05-29 NOTE — Progress Notes (Signed)
Recreation Therapy Notes  Date: 2.10.21 Time: 1000 Location: 500 Hall Dayroom  Group Topic: Communication  Goal Area(s) Addresses:  Patient will effectively communicate with peers in group.  Patient will verbalize benefit of healthy communication.  Intervention: Group Discussion  Activity: Check-In.  Patients were to discuss any concerns or positive achievements they may have.  Education: Communication, Discharge Planning  Education Outcome: Acknowledges understanding/In group clarification offered/Needs additional education.   Clinical Observations/Feedback: Pt did not attend group.    Victorino Sparrow, LRT/CTRS         Victorino Sparrow A 05/29/2019 11:48 AM

## 2019-05-29 NOTE — Progress Notes (Signed)
   05/29/19 2100  Psych Admission Type (Psych Patients Only)  Admission Status Involuntary  Psychosocial Assessment  Patient Complaints Anxiety  Eye Contact Fair  Facial Expression Animated;Anxious;Worried  Affect Anxious;Preoccupied;Apprehensive;Irritable  Speech Logical/coherent;Pressured;Loud;Rapid;Tangential  Interaction Assertive;Dominating  Motor Activity Fidgety;Restless  Appearance/Hygiene In scrubs;Unremarkable  Behavior Characteristics Agressive verbally;Restless  Mood Preoccupied;Suspicious;Anxious  Aggressive Behavior  Effect No apparent injury  Thought Process  Coherency Disorganized;Flight of ideas;Tangential;Loose associations  Content Blaming others;Paranoia;Preoccupation  Delusions Paranoid;Persecutory  Perception WDL  Hallucination None reported or observed  Judgment Poor  Confusion Mild  Danger to Self  Current suicidal ideation? Denies  Danger to Others  Danger to Others None reported or observed

## 2019-05-29 NOTE — Progress Notes (Signed)
Pt continues to discuss if situations. Pt was informed that writer was going to deal with what was actually going on. Pt stated he was not going to take the medications, pt asked for 15 minutes so he could call the doctor and have the medication changed. Pt was informed that the doctor was not going to change the medication to night, he would have to discuss that with him in the morning. Pt stated give him 15 minutes and in that time he would come to the medication window and take his medicine.

## 2019-05-29 NOTE — Tx Team (Signed)
Interdisciplinary Treatment and Diagnostic Plan Update  05/29/2019 Time of Session: 10:50am  Daniel Shannon MRN: [id number]  Principal Diagnosis: <principal problem not specified>  Secondary Diagnoses: Active Problems:   Bipolar disorder ([place])   Current Medications:  Current Facility-Administered Medications  Medication Dose Route Frequency Provider Last Rate Last Admin  . cloNIDine (CATAPRES) tablet 0.1 mg  0.1 mg Oral Q4H PRN Johnn Hai, MD      . divalproex (DEPAKOTE) DR tablet 500 mg  500 mg Oral Q8H Johnn Hai, MD   500 mg at 05/29/19 0800  . LORazepam (ATIVAN) tablet 1 mg  1 mg Oral Q4H PRN Cobos, Myer Peer, MD   1 mg at 05/28/19 1046  . OLANZapine (ZYPREXA) injection 10 mg  10 mg Intramuscular TID PRN Johnn Hai, MD      . OLANZapine [facility]) tablet 10 mg  10 mg Oral Daily Johnn Hai, MD   10 mg at 05/29/19 0800  . OLANZapine zydis (ZYPREXA) disintegrating tablet 20 mg  20 mg Oral QHS Johnn Hai, MD   20 mg at 05/28/19 2137  . temazepam (RESTORIL) capsule 30 mg  30 mg Oral QHS PRN Johnn Hai, MD       PTA Medications: Medications Prior to Admission  Medication Sig Dispense Refill Last Dose  . cholecalciferol (VITAMIN D3) 25 MCG (1000 UNIT) tablet Take 1,000 Units by mouth daily.     . divalproex (DEPAKOTE ER) 500 MG 24 hr tablet Take 1,000 mg by mouth at bedtime.     Marland Kitchen QUEtiapine (SEROQUEL) 50 MG tablet Take 50 mg by mouth at bedtime.       Patient Stressors: Health problems Occupational concerns Traumatic event  Patient Strengths: Average or above average intelligence Capable of independent living Child psychotherapist Motivation for treatment/growth Physical Health  Treatment Modalities: Medication Management, Group therapy, Case management,  1 to 1 session with clinician, Psychoeducation, Recreational therapy.   Physician Treatment Plan for Primary Diagnosis: <principal problem not specified> Long Term Goal(s): Improvement in symptoms  so as ready for discharge Improvement in symptoms so as ready for discharge   Short Term Goals: Ability to identify changes in lifestyle to reduce recurrence of condition will improve Ability to identify changes in lifestyle to reduce recurrence of condition will improve  Medication Management: Evaluate patient's response, side effects, and tolerance of medication regimen.  Therapeutic Interventions: 1 to 1 sessions, Unit Group sessions and Medication administration.  Evaluation of Outcomes: Not Progressing  Physician Treatment Plan for Secondary Diagnosis: Active Problems:   Bipolar disorder ([place])  Long Term Goal(s): Improvement in symptoms so as ready for discharge Improvement in symptoms so as ready for discharge   Short Term Goals: Ability to identify changes in lifestyle to reduce recurrence of condition will improve Ability to identify changes in lifestyle to reduce recurrence of condition will improve     Medication Management: Evaluate patient's response, side effects, and tolerance of medication regimen.  Therapeutic Interventions: 1 to 1 sessions, Unit Group sessions and Medication administration.  Evaluation of Outcomes: Not Progressing   RN Treatment Plan for Primary Diagnosis: <principal problem not specified> Long Term Goal(s): Knowledge of disease and therapeutic regimen to maintain health will improve  Short Term Goals: Ability to participate in decision making will improve, Ability to verbalize feelings will improve, Ability to disclose and discuss suicidal ideas, Ability to identify and develop effective coping behaviors will improve and Compliance with prescribed medications will improve  Medication Management: RN will administer medications as ordered by  provider, will assess and evaluate patient's response and provide education to patient for prescribed medication. RN will report any adverse and/or side effects to prescribing provider.  Therapeutic  Interventions: 1 on 1 counseling sessions, Psychoeducation, Medication administration, Evaluate responses to treatment, Monitor vital signs and CBGs as ordered, Perform/monitor CIWA, COWS, AIMS and Fall Risk screenings as ordered, Perform wound care treatments as ordered.  Evaluation of Outcomes: Not Progressing   LCSW Treatment Plan for Primary Diagnosis: <principal problem not specified> Long Term Goal(s): Safe transition to appropriate next level of care at discharge, Engage patient in therapeutic group addressing interpersonal concerns.  Short Term Goals: Engage patient in aftercare planning with referrals and resources and Increase skills for wellness and recovery  Therapeutic Interventions: Assess for all discharge needs, 1 to 1 time with Social worker, Explore available resources and support systems, Assess for adequacy in community support network, Educate family and significant other(s) on suicide prevention, Complete Psychosocial Assessment, Interpersonal group therapy.  Evaluation of Outcomes: Not Progressing   Progress in Treatment: Attending groups: No. Participating in groups: No. Taking medication as prescribed: Yes. Toleration medication: Yes. Family/Significant other contact made: No, will contact:  if given consent  Patient understands diagnosis: No. Discussing patient identified problems/goals with staff: Yes. Medical problems stabilized or resolved: Yes. Denies suicidal/homicidal ideation: Yes. Issues/concerns per patient self-inventory: No. Other:   New problem(s) identified: No, Describe:  none   New Short Term/Long Term Goal(s): Medication stabilization, elimination of SI thoughts, and development of a comprehensive mental wellness plan.   Patient Goals:  "To go home, sleep in my bed, and see my wife"  Discharge Plan or Barriers: CSW will continue to follow up for appropriate referrals and possible discharge planning  Reason for Continuation of  Hospitalization: Delusions  Mania Medication stabilization  Estimated Length of Stay: 3-5 days   Attendees: Patient: Daniel Shannon 05/29/2019   Physician:  05/29/2019   Nursing:  05/29/2019   RN Care Manager: 05/29/2019   Social Worker: Ardelle Anton, [place] 05/29/2019   Recreational Therapist:  05/29/2019   Other: Ovidio Kin, MSW intern  05/29/2019   Other:  05/29/2019   Other: 05/29/2019     Scribe for Treatment Team: Billey Chang, [place] Work 05/29/2019 11:06 AM

## 2019-05-29 NOTE — Progress Notes (Addendum)
[facility] MD Progress Note  05/29/2019 11:13 AM Daniel Shannon  MRN:  [id number] Subjective:   When seen at 930: Patient continues to interrupt the examiner and lobby for discharge, immediately identifies me as "the enemy" stating he is cold needs to go so forth rambles pretty much continually, and I cannot finish his single question of the mental status exam.  When I attempt to come back and examine him at around 11 AM he is actually asleep so were going to let him sleep and I will try and gauge his status later this afternoon. Staff reports he did little better when he took the lorazepam with his morning medication yesterday  When seen around 11 AM was noted to be sleeping  When seen at 1:30 PM: On this interview the patient requested a staff member to come in and described for him and keep notes, when I explained that this was not possible, there was only 1 staff member on the floor responsible for watching all of the patients he continued to take notes by himself, insisting he had no advocate, and that his cell phone would be his "advocate" as well as his notetaking. He repeatedly says "Shushhh!!"  To me, and continues to take control the interview and interrupts, he also stands up about halfway through and states he wants to pace, and walks around in a circle- I asked him to reflect on why he thinks he is here he goes into great detail talking about what he did Saturday night "I did something evil, I scared someone" and he elaborates that he was worried about hospital mergers and wanted to talk to the [place] about whether they would be "fair to me in my group" but I repeatedly asked him why he was propelled to do that, and eventually he gets around the same "I had acute mania due to lack of sleep" then corrects himself to say "no, I feared something bad was going to happen to me and I could not handle it," and then he goes back into discussions about hospital mergers.  But he states he is 60 to 70%  better.  At the end of the interview he states "you are the enemy to me" and before that he continually pleads to get out of the hospital stating he would give me "$20" and when I explained to him that that is not possible I am not interested in taking his cash and so forth and that no amount of money would prevent me doing due diligence in treating his condition, he states money is not important to him and continues to try and exchange money for discharge. Clearly still manic and displaying poor judgment poor insight so forth but is compliant thus far with medications. I did explain to the patient that his tolerance of medications indicates there is still acute mania.  Principal Problem: New onset manic symptomatology, patient presenting multiple barriers to affective evaluation and treatment and certainly needs neuroimaging Diagnosis: Active Problems:   Bipolar disorder ([place])  Total Time spent with patient: 20 minutes  Past Psychiatric History: Negative  Past Medical History: History reviewed. No pertinent past medical history. History reviewed. No pertinent surgical history. Family History: History reviewed. No pertinent family history. Family Psychiatric  History: Sister bipolar Social History:  Social History   Substance and Sexual Activity  Alcohol Use Not Currently     Social History   Substance and Sexual Activity  Drug Use Not Currently    Social History  Socioeconomic History  . Marital status: Married    Spouse name: Not on file  . Number of children: Not on file  . Years of education: Not on file  . Highest education level: Not on file  Occupational History  . Not on file  Tobacco Use  . Smoking status: Never Smoker  . Smokeless tobacco: Never Used  Substance and Sexual Activity  . Alcohol use: Not Currently  . Drug use: Not Currently  . Sexual activity: Yes  Other Topics Concern  . Not on file  Social History Narrative  . Not on file   Social  Determinants of Health   Financial Resource Strain:   . Difficulty of Paying Living Expenses: Not on file  Food Insecurity:   . Worried About Charity fundraiser in the Last Year: Not on file  . Ran Out of Food in the Last Year: Not on file  Transportation Needs:   . Lack of Transportation (Medical): Not on file  . Lack of Transportation (Non-Medical): Not on file  Physical Activity:   . Days of Exercise per Week: Not on file  . Minutes of Exercise per Session: Not on file  Stress:   . Feeling of Stress : Not on file  Social Connections:   . Frequency of Communication with Friends and Family: Not on file  . Frequency of Social Gatherings with Friends and Family: Not on file  . Attends Religious Services: Not on file  . Active Member of Clubs or Organizations: Not on file  . Attends Archivist Meetings: Not on file  . Marital Status: Not on file   Additional Social History:    Pain Medications: See MAR Prescriptions: See MAR Over the Counter: See MAR History of alcohol / drug use?: No history of alcohol / drug abuse                    Sleep: Poor  Appetite:  Fair  Current Medications: Current Facility-Administered Medications  Medication Dose Route Frequency Provider Last Rate Last Admin  . cloNIDine (CATAPRES) tablet 0.1 mg  0.1 mg Oral Q4H PRN Johnn Hai, MD      . divalproex (DEPAKOTE) DR tablet 500 mg  500 mg Oral Q8H Johnn Hai, MD   500 mg at 05/29/19 0800  . LORazepam (ATIVAN) tablet 1 mg  1 mg Oral Q4H PRN Cobos, Myer Peer, MD   1 mg at 05/28/19 1046  . LORazepam (ATIVAN) tablet 1 mg  1 mg Oral BID Johnn Hai, MD      . OLANZapine [facility]) injection 10 mg  10 mg Intramuscular TID PRN Johnn Hai, MD      . OLANZapine [facility]) tablet 10 mg  10 mg Oral Daily Johnn Hai, MD   10 mg at 05/29/19 0800  . OLANZapine zydis (ZYPREXA) disintegrating tablet 20 mg  20 mg Oral QHS Johnn Hai, MD   20 mg at 05/28/19 2137  . temazepam (RESTORIL)  capsule 45 mg  45 mg Oral QHS Johnn Hai, MD        Lab Results: No results found for this or any previous visit (from the past 48 hour(s)).  Blood Alcohol level:  Lab Results  Component Value Date   ETH <10 [id number]    Metabolic Disorder Labs: No results found for: HGBA1C, MPG No results found for: PROLACTIN No results found for: CHOL, TRIG, HDL, CHOLHDL, VLDL, LDLCALC  Physical Findings: AIMS: Facial and Oral Movements Muscles of Facial Expression:  None, normal Lips and Perioral Area: None, normal Jaw: None, normal Tongue: None, normal,Extremity Movements Upper (arms, wrists, hands, fingers): None, normal Lower (legs, knees, ankles, toes): None, normal, Trunk Movements Neck, shoulders, hips: None, normal, Overall Severity Severity of abnormal movements (highest score from questions above): None, normal Incapacitation due to abnormal movements: None, normal Patient's awareness of abnormal movements (rate only patient's report): No Awareness, Dental Status Current problems with teeth and/or dentures?: No Does patient usually wear dentures?: No  CIWA:    COWS:     Musculoskeletal: Strength & Muscle Tone: within normal limits Gait & Station: normal Patient leans: N/A  Psychiatric Specialty Exam: Physical Exam  Review of Systems  Blood pressure (!) 143/94, pulse 98, temperature 98 F (36.7 C), temperature source Oral, resp. rate 18, height 6\' 1"  (1.854 m), weight 86.2 kg, SpO2 98 %.Body mass index is 25.07 kg/m.  General Appearance: Casual  Eye Contact:  Minimal  Speech:  Pressured  Volume:  Normal  Mood:  Manic/hypomanic  Affect:  Congruent  Thought Process:  Descriptions of Associations: Circumstantial  Orientation:  Full (Time, Place, and Person)  Thought Content:  Illogical  Suicidal Thoughts:  No  Homicidal Thoughts:  No  Memory:  Immediate;   Good Recent;   Good Remote;   Good  Judgement:  Impaired  Insight:  Lacking  Psychomotor Activity:  Normal   Concentration:  Concentration: Good and Attention Span: Fair  Recall:  Good  Fund of Knowledge:  Good  Language:  Good  Akathisia:  Negative  Handed:  Right  AIMS (if indicated):     Assets:  Agricultural consultant Housing Intimacy Leisure Time Physical Health Resilience Social Support Talents/Skills Transportation  ADL's:  Intact  Cognition:  WNL  Sleep:  Number of Hours: 3.75     Treatment Plan Summary: Daily contact with patient to assess and evaluate symptoms and progress in treatment and Medication management  Continue Depakote/olanzapine  after reevaluation we will do the following Add temazepam standing rather than as needed, add lorazepam standing rather than as needed Patient asked me to phone his lawyer but I do not think this is appropriate or necessary at this point in time. We will phone his wife with his permission to give her an update also discussed neuroimaging and patient insists he would get it as an outpatient  Johnn Hai, MD 05/29/2019, 11:13 AM

## 2019-05-29 NOTE — BHH Counselor (Signed)
CSW attempted to meet with patient for the second time on 05/28/2019 for assessment.    Patient was meeting with his nurse and doctor when CSW attempted twice.

## 2019-05-29 NOTE — Progress Notes (Signed)
Pt appears to have a problem when he is not in control. Pt has a problem conforming to rules he is not in control of.

## 2019-05-30 MED ORDER — TIAGABINE HCL 2 MG PO TABS
8.0000 mg | ORAL_TABLET | Freq: Every day | ORAL | Status: DC
  Administered 2019-05-30: 8 mg via ORAL
  Filled 2019-05-30 (×3): qty 4

## 2019-05-30 NOTE — B[facility] Group Notes (Signed)
[facility] LCSW Group Therapy Note  Date/Time: 05/30/2019 @ 1:30pm  Type of Therapy and Topic:  Group Therapy:  Overcoming Obstacles  Participation Level:  [facility] PARTICIPATION LEVEL: Did Not Attend  Description of Group:    In this group patients will be encouraged to explore what they see as obstacles to their own wellness and recovery. They will be guided to discuss their thoughts, feelings, and behaviors related to these obstacles. The group will process together ways to cope with barriers, with attention given to specific choices patients can make. Each patient will be challenged to identify changes they are motivated to make in order to overcome their obstacles. This group will be process-oriented, with patients participating in exploration of their own experiences as well as giving and receiving support and challenge from other group members.  Therapeutic Goals: 1. Patient will identify personal and current obstacles as they relate to admission. 2. Patient will identify barriers that currently interfere with their wellness or overcoming obstacles.  3. Patient will identify feelings, thought process and behaviors related to these barriers. 4. Patient will identify two changes they are willing to make to overcome these obstacles:    Summary of Patient Progress   There was no group due to high acuity of the unit.    Therapeutic Modalities:   Cognitive Behavioral Therapy Solution Focused Therapy Motivational Interviewing Relapse Prevention Therapy   Ardelle Anton, LCSW

## 2019-05-30 NOTE — Progress Notes (Signed)
   05/30/19 2314  Psych Admission Type (Psych Patients Only)  Admission Status Involuntary  Psychosocial Assessment  Patient Complaints Anxiety  Eye Contact Fair  Facial Expression Animated;Anxious;Worried  Affect Anxious;Preoccupied;Apprehensive  Speech Logical/coherent;Pressured;Rapid;Tangential  Interaction Assertive;Dominating  Motor Activity Fidgety;Restless  Appearance/Hygiene In scrubs;Unremarkable  Behavior Characteristics Cooperative;Restless  Mood Suspicious;Anxious;Preoccupied  Aggressive Behavior  Effect No apparent injury  Thought Process  Coherency Disorganized;Flight of ideas;Tangential;Loose associations  Content Blaming others;Paranoia;Preoccupation  Delusions Paranoid;Persecutory  Perception WDL  Hallucination None reported or observed  Confusion Mild  Danger to Self  Current suicidal ideation? Denies  Danger to Others  Danger to Others None reported or observed   Pt is still agitated and doesn't want to take meds. Pt bargaining over which meds to take and still trying to be controlling.

## 2019-05-30 NOTE — BHH Counselor (Signed)
CSW attempted assessment with patient but he was asleep right after taking medications.  This is the 3rd attempt for patient.   CSW will attempt to consent to talk with wife and do assessment with her on next attempt.

## 2019-05-30 NOTE — Progress Notes (Signed)
Recreation Therapy Notes  Date: 2.11.21 Time: 0950 Location: [address] Dayroom  Group Topic: Leisure Education  Goal Area(s) Addresses:  Patient will identify positive leisure activities.  Patient will identify one positive benefit of participation in leisure activities.   Intervention: Leisure Group Game  Activity: Keep It Chartered certified accountant.  LRT and patients had to toss or bounce the ball to each other.  LRT kept time of how long the ball was in motion.  The ball was to stay moving at all times.  The ball could not come to a stop.  If the ball stopped, the time started over.  Education:  Leisure Education, Dentist  Education Outcome: Acknowledges education/In group clarification offered/Needs additional education  Clinical Observations/Feedback: Pt did not attend group.    Victorino Sparrow, LRT/CTRS         Victorino Sparrow A 05/30/2019 10:46 AM

## 2019-05-30 NOTE — Progress Notes (Signed)
[facility] MD Progress Note  05/30/2019 10:42 AM Daniel Shannon  MRN:  [id number] Subjective:    Patient has made no progress despite medication compliance, he remains hyperverbal, rambling in speech, insistent upon discharge, controlling of the situation.  When attempting to do rounds today, he asked "what is the password" and would not allow for legitimate interview until we knew his "password" and further was suspicious of the nurse practitioner accompanying me Had also used his shoes to block the door so they were taken away by the staff Due to paranoia and mania unable to engage meaningfully in interview process but will continue to try through the day.  Patient states he did sleep  Principal Problem: New onset mania with lack of insight and opposition to diagnosis and treatment Diagnosis: Active Problems:   Bipolar disorder ([place])  Total Time spent with patient: 15 minutes  Past Psychiatric History: Negative  Past Medical History: History reviewed. No pertinent past medical history. History reviewed. No pertinent surgical history. Family History: History reviewed. No pertinent family history. Family Psychiatric  History: Sister bipolar Social History:  Social History   Substance and Sexual Activity  Alcohol Use Not Currently     Social History   Substance and Sexual Activity  Drug Use Not Currently    Social History   Socioeconomic History  . Marital status: Married    Spouse name: Not on file  . Number of children: Not on file  . Years of education: Not on file  . Highest education level: Not on file  Occupational History  . Not on file  Tobacco Use  . Smoking status: Never Smoker  . Smokeless tobacco: Never Used  Substance and Sexual Activity  . Alcohol use: Not Currently  . Drug use: Not Currently  . Sexual activity: Yes  Other Topics Concern  . Not on file  Social History Narrative  . Not on file   Social Determinants of Health   Financial Resource Strain:    . Difficulty of Paying Living Expenses: Not on file  Food Insecurity:   . Worried About Charity fundraiser in the Last Year: Not on file  . Ran Out of Food in the Last Year: Not on file  Transportation Needs:   . Lack of Transportation (Medical): Not on file  . Lack of Transportation (Non-Medical): Not on file  Physical Activity:   . Days of Exercise per Week: Not on file  . Minutes of Exercise per Session: Not on file  Stress:   . Feeling of Stress : Not on file  Social Connections:   . Frequency of Communication with Friends and Family: Not on file  . Frequency of Social Gatherings with Friends and Family: Not on file  . Attends Religious Services: Not on file  . Active Member of Clubs or Organizations: Not on file  . Attends Archivist Meetings: Not on file  . Marital Status: Not on file   Additional Social History:    Pain Medications: See MAR Prescriptions: See MAR Over the Counter: See MAR History of alcohol / drug use?: No history of alcohol / drug abuse                    Sleep: Poor  Appetite:  Poor  Current Medications: Current Facility-Administered Medications  Medication Dose Route Frequency Provider Last Rate Last Admin  . cloNIDine (CATAPRES) tablet 0.1 mg  0.1 mg Oral Q4H PRN Johnn Hai, MD      .  divalproex (DEPAKOTE) DR tablet 500 mg  500 mg Oral Q8H Johnn Hai, MD   500 mg at 05/30/19 0935  . LORazepam (ATIVAN) tablet 1 mg  1 mg Oral Q4H PRN Cobos, Myer Peer, MD   1 mg at 05/28/19 1046  . LORazepam (ATIVAN) tablet 1 mg  1 mg Oral BID Johnn Hai, MD   1 mg at 05/30/19 0935  . OLANZapine (ZYPREXA) injection 10 mg  10 mg Intramuscular TID PRN Johnn Hai, MD      . OLANZapine zydis (ZYPREXA) disintegrating tablet 10 mg  10 mg Oral Daily Johnn Hai, MD   10 mg at 05/30/19 0936  . OLANZapine zydis (ZYPREXA) disintegrating tablet 20 mg  20 mg Oral QHS Johnn Hai, MD   20 mg at 05/29/19 2131  . temazepam (RESTORIL) capsule 45 mg   45 mg Oral QHS Johnn Hai, MD   45 mg at 05/29/19 2131    Lab Results:  Results for orders placed or performed during the hospital encounter of 05/27/19 (from the past 48 hour(s))  TSH     Status: None   Collection Time: 05/29/19  6:31 PM  Result Value Ref Range   TSH 1.103 0.350 - 4.500 uIU/mL    Comment: Performed by a 3rd Generation assay with a functional sensitivity of <=0.01 uIU/mL. Performed at [facility], [place] [address] [place], [place] [address]     Blood Alcohol level:  Lab Results  Component Value Date   ETH <10 [id number]    Metabolic Disorder Labs: No results found for: HGBA1C, MPG No results found for: PROLACTIN No results found for: CHOL, TRIG, HDL, CHOLHDL, VLDL, LDLCALC  Physical Findings: AIMS: Facial and Oral Movements Muscles of Facial Expression: None, normal Lips and Perioral Area: None, normal Jaw: None, normal Tongue: None, normal,Extremity Movements Upper (arms, wrists, hands, fingers): None, normal Lower (legs, knees, ankles, toes): None, normal, Trunk Movements Neck, shoulders, hips: None, normal, Overall Severity Severity of abnormal movements (highest score from questions above): None, normal Incapacitation due to abnormal movements: None, normal Patient's awareness of abnormal movements (rate only patient's report): No Awareness, Dental Status Current problems with teeth and/or dentures?: No Does patient usually wear dentures?: No  CIWA:    COWS:     Musculoskeletal: Strength & Muscle Tone: within normal limits Gait & Station: normal Patient leans: N/A  Psychiatric Specialty Exam: Physical Exam  Review of Systems  Blood pressure (!) 143/94, pulse 98, temperature 98 F (36.7 C), temperature source Oral, resp. rate 18, height 6\' 1"  (1.854 m), weight 86.2 kg, SpO2 98 %.Body mass index is 25.07 kg/m.  General Appearance: Casual  Eye Contact:  Fair  Speech:  Pressured  Volume:  Increased  Mood:  Manic and  oppositional  Affect:  Congruent  Thought Process:  Irrelevant and Descriptions of Associations: Circumstantial and obsessive about discharge  Orientation:  Full (Time, Place, and Person)  Thought Content:  Illogical  Suicidal Thoughts:  No  Homicidal Thoughts:  No  Memory:  Immediate;   Fair Recent;   Good Remote;   Good  Judgement:  Impaired  Insight:  Shallow  Psychomotor Activity:  Somewhat self agitating as interviews progress or are attempted  Concentration:  Concentration: Poor and Attention Span: Poor  Recall:  Good  Fund of Knowledge:  Good  Language:  Good  Akathisia:  Negative  Handed:  Right  AIMS (if indicated):     Assets:  Agricultural consultant Housing Intimacy Leisure Time Physical  Health Resilience Social Support Heritage manager  ADL's:  Intact  Cognition:  WNL  Sleep:  Number of Hours: 4.25   Treatment Plan Summary: Daily contact with patient to assess and evaluate symptoms and progress in treatment and Medication management  Continue olanzapine continue Depakote patient wants to simplify med regimen however at this point we believe the calming effect of divided dosing is more helpful. Still not getting adequate or deep sleep so we will continue to use combination sleep meds when possible no change in precautions continue reality based therapy   Joy Reiger, MD 05/30/2019, 10:42 AM

## 2019-05-30 NOTE — Progress Notes (Signed)
Pt stated he would like to reduce the number of pills he takes and the number of times he takes them reduced.

## 2019-05-30 NOTE — Progress Notes (Signed)
Pt began taking meds at 2150 and finished taking meds at 2235. Pt constantly redirected from writing in his journal to focusing on taking his medications. Pt bargaining to take 30 mg of Restoril instead of 45 mg. Pt did take all 45 mg but wants to only take 30 mg. Pt asked this writer to state his objections to the medicines. Pt still wants to stop all medications.

## 2019-05-30 NOTE — Progress Notes (Signed)
Recreation Therapy Notes  2.11.21  LRT had a brief conversation with patient.  Patient unable to focus to complete assessment.  Patient focused on other things and writing everything down.  Patient asks numerous questions before answering questions asked of him.  LRT will attempt to assess patient at a later time.   Victorino Sparrow, LRT/CTRS     Ria Comment, Jaxston Chohan A 05/30/2019 1:42 PM

## 2019-05-31 MED ORDER — OLANZAPINE 10 MG PO TBDP
10.0000 mg | ORAL_TABLET | Freq: Every day | ORAL | Status: DC
  Administered 2019-05-31: 10 mg via ORAL
  Filled 2019-05-31 (×3): qty 1

## 2019-05-31 MED ORDER — DIVALPROEX SODIUM 250 MG PO DR TAB
250.0000 mg | DELAYED_RELEASE_TABLET | Freq: Two times a day (BID) | ORAL | Status: DC
  Administered 2019-05-31 – 2019-06-01 (×2): 250 mg via ORAL
  Filled 2019-05-31 (×6): qty 1

## 2019-05-31 MED ORDER — TEMAZEPAM 30 MG PO CAPS
30.0000 mg | ORAL_CAPSULE | Freq: Every day | ORAL | Status: DC
  Administered 2019-05-31 – 2019-06-03 (×4): 30 mg via ORAL
  Filled 2019-05-31 (×4): qty 1

## 2019-05-31 MED ORDER — TIAGABINE HCL 4 MG PO TABS
4.0000 mg | ORAL_TABLET | Freq: Every day | ORAL | Status: DC
  Administered 2019-05-31: 4 mg via ORAL
  Filled 2019-05-31 (×3): qty 1

## 2019-05-31 NOTE — Progress Notes (Signed)
  Pt is caucasian male  of 64 y.o. , DOB [birth year], MRN  [id number]  presents IVC with erratic behavior,   Patient  Reports medication is hurting him, did take morning medications. Poor appetite, energy normal, and poor sleeping pattern with mediaction. Pt denies   depression ,, hopelessness, and anxiety Pt denies  S,  HI or AVH.  LBM yesterday.  Vitals signs being monitored.   Pt had 1 on 1 today based on unsteady gait reported to provider, pt slept for 3 hours with observation.   Medication given as Rx, no adverse reaction observed, safety maintained with q15 minute checks.   Lolly Mustache. Bobby Rumpf MSN, [place], [facility] [phone]

## 2019-05-31 NOTE — BHH Suicide Risk Assessment (Signed)
[facility] INPATIENT:  Family/Significant Other Suicide Prevention Education  Suicide Prevention Education:  Education Completed; [name], Terryn Begay, has been identified by the patient as the family member/significant other with whom the patient will be residing, and identified as the person(s) who will aid the patient in the event of a mental health crisis (suicidal ideations/suicide attempt).  With written consent from the patient, the family member/significant other has been provided the following suicide prevention education, prior to the and/or following the discharge of the patient.  The suicide prevention education provided includes the following:  Suicide risk factors  Suicide prevention and interventions  National Suicide Hotline telephone number  [facility] assessment telephone number  [facility] Emergency Assistance [place] and/or Residential Mobile Crisis Unit telephone number  Request made of family/significant other to:  Remove weapons (e.g., guns, rifles, knives), all items previously/currently identified as safety concern.    Remove drugs/medications (over-the-counter, prescriptions, illicit drugs), all items previously/currently identified as a safety concern.  The family member/significant other verbalizes understanding of the suicide prevention education information provided.  The family member/significant other agrees to remove the items of safety concern listed above.   CSW contacted pt's wife, Jadrien Reynaga. [name] stated that the patient just needs some sleep and is having a manic episode. [name] expressed that he has been getting no sleep due to helping his brother and that his mother died exactly a year ago. Patient's wife stated that they will set up their own aftercare as he has a new psychiatrist in [place], [place].  [name] denied any guns or weapons in the home.   Trecia Rogers 05/31/2019, 3:22 PM

## 2019-05-31 NOTE — BHH Counselor (Signed)
Adult Comprehensive Assessment  Patient ID: Daniel Shannon, male   DOB: [birth year], 64 y.o.   MRN: [id number]  Information Source: Information source: ([name], Sotiris [place])  Current Stressors:  Patient states their primary concerns and needs for treatment are:: [name] stated, "He is having a manic episode. We took him to a doctor in [place] and she gave him some medications. He refused the medications so she told us to take him to the hospital" Patient states their goals for this hospitilization and ongoing recovery are:: [name] stated, "I want him to go back to where he was before" Educational / Learning stressors: [name] denied stressors Employment / Job issues: [name] denied stressors Family Relationships: [name] stated, "He took off a week from work to go help his brother and he was getting no sleep. Plus his mother passed away. He does way too much for the family". Financial / Lack of resources (include bankruptcy): [name] denied stressors Housing / Lack of housing: [name] denied stressors Physical health (include injuries & life threatening diseases): Pt wife denied stressors Social relationships: Pt wife denied stressors Substance abuse: Pt wife denied stressors Bereavement / Loss: [name] stated that he lost his mother a year ago (February 2020)  Living/Environment/Situation:  Living Arrangements: Spouse/significant other, Children Living conditions (as described by patient or guardian): [name] stated, "wonderful" Who else lives in the home?: [name] How long has patient lived in current situation?: [name] stated, "We have been together for almost 41 years this fall"  Family History:  Marital status: Married Number of Years Married: 39 What types of issues is patient dealing with in the relationship?: [name] denies any issues in their marriage. She stated, "he is wonderful" Are you sexually active?: No What is your sexual orientation?:  Heterosexual Has your sexual activity been affected by drugs, alcohol, medication, or emotional stress?: No Does patient have children?: Yes How many children?: 3(2 sons and 1 daughter) How is patient's relationship with their children?: [name] stated, "They are very close"  Childhood History:  By whom was/is the patient raised?: Both parents Description of patient's relationship with caregiver when they were a child: [name] stated, "his parents were pretty strict. He was well loved, though. He is oldest of 4. His father was a surgeron and he didn't see him much" Patient's description of current relationship with people who raised him/her: [name] stated, "his mother is deceased. His father is 40 years old and they are pretty close" How were you disciplined when you got in trouble as a child/adolescent?: [name] stated, "pretty strict but appropriate" Does patient have siblings?: Yes Number of Siblings: 3 Description of patient's current relationship with siblings: [name] stated, "pretty good. He is close with his sister and his younger brother". 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 If attempted suicide, did drugs/alcohol play a role in this?: No Alcohol/Substance Abuse Treatment Hx: Denies past history Has alcohol/substance abuse ever caused legal problems?: No  Social Support System:   Patient's Community Support System: Good Describe Community Support System: [name] stated, "Myself, his mother until her death, his father, his children, his best friend, and his siblings" Type of faith/religion: Darrick Meigs How does patient's faith help to cope with current illness?: [name] stated, "He was an elder for 3 years. Our son is a Company secretary. He devoted more time to it than most people".  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 He does half marathons a few times a year.  Strengths/Needs:   What is the patient's perception of their strengths?: [name] stated, "he is a people person, everyone loves him, intelligent, he loves his family, and he has nice handwriting" Patient states they can use these personal strengths during their treatment to contribute to their recovery: N/A Patient states these barriers may affect/interfere with their treatment: N/A Patient states these barriers may affect their return to the community: N/A Other important information patient would like considered in planning for their treatment: [name] stated, "He will need to stop doing the emergency calls with [facility] when he gets home"  Discharge Plan:   Currently receiving community mental health services:  No Patient states concerns and preferences for aftercare planning are: [name] stated, "We have a private psychiatrist. We will get that together" Patient states they will know when they are safe and ready for discharge when: [name] stated, "anytime. 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 Pt's diagnosis is: Bipolar disorder. Recommendations for pt include: crisis stabilization, therapeutic milieu, medication management, attend and participate in group therapy, and development of a comprehensive mental wellness plan.  Trecia Rogers. 05/31/2019

## 2019-05-31 NOTE — Progress Notes (Signed)
Recreation Therapy Notes  Date: 2.12.21 Time: 1000 Location:  [address] Dayroom  Group Topic: Communication, Team Building, Problem Solving  Goal Area(s) Addresses:  Patient will effectively work with peer towards shared goal.  Patient will identify skills used to make activity successful.  Patient will identify how skills used during activity can be used to reach post d/c goals.   Intervention: STEM Activity  Activity:  Straw Bridge.  In groups, patients were given 15 plastic straws and a 45ft piece of masking tape.  Patients were to build a free standing bridge using all the straws and tape given.  Patients could bend straws as needed to construct bridge.  The bridge constructed had to be able to hold a small puzzle box.    Education: Education officer, community, Discharge Planning   Education Outcome: Acknowledges education/In group clarification offered/Needs additional education.   Clinical Observations/Feedback: Pt did not attend group session.    Victorino Sparrow, LRT/CTRS         Victorino Sparrow A 05/31/2019 11:35 AM

## 2019-05-31 NOTE — Progress Notes (Signed)
Spirituality group facilitated by Simone Curia, MDiv, [facility].  Group focused on the topic of "hope."   Patients were led in a facilitated discussion, creating a word cloud to respond to the prompt "Hope is."   Patients named experiences both past and present that inform their understanding of hope  Patients engaged in a visual explorer activity - choosing a photo that represented hope for today.    DID NOT ATTEND

## 2019-05-31 NOTE — BHH Counselor (Signed)
CSW attempted assessment with patient, but he was asleep. This was the CSW's 4th attempt.    CSW attempted to call pt's wife, Tyjae Christoffer, to complete assessment with her and talk with her, but she did not answer. CSW left HIPPA compliant voicemail.

## 2019-05-31 NOTE — Progress Notes (Signed)
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  He has been resistant to his diagnosis, controlling of aspects of his care, and very obsessive throughout his stay.  He did however sleep better last night, but this morning is more sluggish and the medications seem to be too sedating at this point in time, when prior they had seemed to have no benefit/nor sedation.  His Depakote level, despite presumed compliance is 15.  At the present time he is sluggish and unsteady he rambles and many of his words are slurred and difficult to understand.  He is again extremely focused in the process of upon discharge, repeats my name over and over and again insists upon going home but much of his speech is garbled this morning. He did state his "brain was spinning" blaming the medications  Principal Problem: New onset mania Diagnosis: Active Problems:   Bipolar disorder ([place])  Total Time spent with patient: 20 minutes  Past Psychiatric History: Negative  Past Medical History: History reviewed. No pertinent past medical history. History reviewed. No pertinent surgical history. Family History: History reviewed. No pertinent family history. Family Psychiatric  History: Sister bipolar brother depression Social History:  Social History   Substance and Sexual Activity  Alcohol Use Not Currently     Social History   Substance and Sexual Activity  Drug Use Not Currently    Social History   Socioeconomic History  . Marital status: Married    Spouse name: Not on file  . Number of children: Not on file  . Years of education: Not on file  . Highest education level: Not on file  Occupational History  . Not  on file  Tobacco Use  . Smoking status: Never Smoker  . Smokeless tobacco: Never Used  Substance and Sexual Activity  . Alcohol use: Not Currently  . Drug use: Not Currently  . Sexual activity: Yes  Other Topics Concern  . Not on file  Social History Narrative  . Not on file   Social Determinants of Health   Financial Resource Strain:   . Difficulty of Paying Living Expenses: Not on file  Food Insecurity:   . Worried About Charity fundraiser in the Last Year: Not on file  . Ran Out of Food in the Last Year: Not on file  Transportation Needs:   . Lack of Transportation (Medical): Not on file  . Lack of Transportation (Non-Medical): Not on file  Physical Activity:   . Days of Exercise per Week: Not on file  . Minutes of Exercise per Session: Not on file  Stress:   . Feeling of Stress : Not on file  Social Connections:   . Frequency of Communication with Friends and Family: Not on file  . Frequency of Social Gatherings with Friends and Family: Not on file  . Attends Religious Services: Not on file  . Active Member of Clubs or Organizations: Not on file  . Attends Archivist Meetings: Not on file  . Marital Status: Not on file   Additional Social History:    Pain Medications: See MAR Prescriptions: See MAR Over the Counter: See MAR History of alcohol / drug use?: No history of alcohol / drug  abuse                    Sleep: Fair  Appetite:  Fair  Current Medications: Current Facility-Administered Medications  Medication Dose Route Frequency Provider Last Rate Last Admin  . cloNIDine (CATAPRES) tablet 0.1 mg  0.1 mg Oral Q4H PRN Johnn Hai, MD      . divalproex (DEPAKOTE) DR tablet 250 mg  250 mg Oral Q12H Johnn Hai, MD      . LORazepam (ATIVAN) tablet 1 mg  1 mg Oral Q4H PRN Cobos, Myer Peer, MD   1 mg at 05/28/19 1046  . OLANZapine (ZYPREXA) injection 10 mg  10 mg Intramuscular TID PRN Johnn Hai, MD      . OLANZapine zydis (ZYPREXA)  disintegrating tablet 10 mg  10 mg Oral QHS Johnn Hai, MD      . temazepam (RESTORIL) capsule 30 mg  30 mg Oral QHS Johnn Hai, MD      . tiaGABine (GABITRIL) tablet 4 mg  4 mg Oral QHS Johnn Hai, MD        Lab Results:  Results for orders placed or performed during the hospital encounter of 05/27/19 (from the past 48 hour(s))  TSH     Status: None   Collection Time: 05/29/19  6:31 PM  Result Value Ref Range   TSH 1.103 0.350 - 4.500 uIU/mL    Comment: Performed by a 3rd Generation assay with a functional sensitivity of <=0.01 uIU/mL. Performed at [facility], [place] [address] [place],  [address]     Blood Alcohol level:  Lab Results  Component Value Date   ETH <10 [id number]    Metabolic Disorder Labs: No results found for: HGBA1C, MPG No results found for: PROLACTIN No results found for: CHOL, TRIG, HDL, CHOLHDL, VLDL, LDLCALC  Physical Findings: AIMS: Facial and Oral Movements Muscles of Facial Expression: None, normal Lips and Perioral Area: None, normal Jaw: None, normal Tongue: None, normal,Extremity Movements Upper (arms, wrists, hands, fingers): None, normal Lower (legs, knees, ankles, toes): None, normal, Trunk Movements Neck, shoulders, hips: None, normal, Shannon Severity Severity of abnormal movements (highest score from questions above): None, normal Incapacitation due to abnormal movements: None, normal Patient's awareness of abnormal movements (rate only patient's report): No Awareness, Dental Status Current problems with teeth and/or dentures?: No Does patient usually wear dentures?: No  CIWA:    COWS:     Musculoskeletal: Strength & Muscle Tone: within normal limits Gait & Station: unsteady Patient leans: N/A  Psychiatric Specialty Exam: Physical Exam  Review of Systems  Blood pressure 138/89, pulse 79, temperature 98 F (36.7 C), temperature source Oral, resp. rate 18, height 6\' 1"  (1.854 m), weight 86.2 kg, SpO2  100 %.Body mass index is 25.07 kg/m.  General Appearance: Disheveled  Eye Contact:  None  Speech:  Garbled, Pressured and Slurred  Volume:  Decreased  Mood:  Though behaviorally more contained is clearly still hypomanic in his presentation and thought pattern  Affect:  Constricted  Thought Process:  Descriptions of Associations: Circumstantial and obsessive about going home  Orientation:  Other:  Person place situation day  Thought Content:  Patient becomes more organized as he becomes more awake but clearly has racing thoughts and singularly obsessed with discharge however no longer asking for "passwords" but still paranoid towards examiners  Suicidal Thoughts:  No  Homicidal Thoughts:  No  Memory:  Immediate;   Good Recent;   Good Remote;   Good  Judgement:  Impaired  Insight:  Shallow  Psychomotor Activity:  Decreased  Concentration:  Concentration: Poor and Attention Span: Poor  Recall:  [place] of Knowledge:  Good  Language:  Good  Akathisia:  Negative  Handed:  Right  AIMS (if indicated):     Assets:  Agricultural consultant Housing Intimacy Leisure Time Physical Health Resilience Social Support Talents/Skills Transportation Vocational/Educational  ADL's:  Intact  Cognition:  WNL  Sleep:  Number of Hours: 6.75     Treatment Plan Summary: Daily contact with patient to assess and evaluate symptoms and progress in treatment and Medication management  Continue but reduce olanzapine continue but reduce Depakote discussed that sedation is not always treatment, continue but lower Gabitril, the theory behind this is promotion of delta wave sleep.  No change in precautions may discharge this weekend. Unable to get MRI as we cannot safely transport at this point in time  [facility], MD 05/31/2019, 11:26 AM

## 2019-05-31 NOTE — Progress Notes (Signed)
[place] NOVEL CORONAVIRUS (COVID-19) DAILY CHECK-OFF SYMPTOMS - answer yes or no to each - every day NO YES  Have you had a fever in the past 24 hours?  . Fever (Temp > 37.80C / 100F) X   Have you had any of these symptoms in the past 24 hours? . New Cough .  Sore Throat  .  Shortness of Breath .  Difficulty Breathing .  Unexplained Body Aches   X   Have you had any one of these symptoms in the past 24 hours not related to allergies?   . Runny Nose .  Nasal Congestion .  Sneezing   X   If you have had runny nose, nasal congestion, sneezing in the past 24 hours, has it worsened?  X   EXPOSURES - check yes or no X   Have you traveled outside the state in the past 14 days?  X   Have you been in contact with someone with a confirmed diagnosis of COVID-19 or PUI in the past 14 days without wearing appropriate PPE?  X   Have you been living in the same home as a person with confirmed diagnosis of COVID-19 or a PUI (household contact)?    X   Have you been diagnosed with COVID-19?    X              What to do next: Answered NO to all: Answered YES to anything:   Proceed with unit schedule Follow the [facility] Inpatient Flowsheet.   Gleb Mcguire K. Derita Michelsen MSN, RN, [facility] [phone] 

## 2019-06-01 LAB — URINALYSIS, ROUTINE W REFLEX MICROSCOPIC
Bacteria, UA: NONE SEEN
Bilirubin Urine: NEGATIVE
Glucose, UA: NEGATIVE mg/dL
Ketones, ur: 20 mg/dL — AB
Leukocytes,Ua: NEGATIVE
Nitrite: NEGATIVE
Protein, ur: NEGATIVE mg/dL
Specific Gravity, Urine: 1.026 (ref 1.005–1.030)
pH: 5 (ref 5.0–8.0)

## 2019-06-01 MED ORDER — OLANZAPINE 5 MG PO TBDP
15.0000 mg | ORAL_TABLET | Freq: Every day | ORAL | Status: DC
  Administered 2019-06-01 – 2019-06-02 (×2): 15 mg via ORAL
  Filled 2019-06-01 (×4): qty 1

## 2019-06-01 MED ORDER — DIVALPROEX SODIUM 250 MG PO DR TAB
750.0000 mg | DELAYED_RELEASE_TABLET | Freq: Every evening | ORAL | Status: DC
  Administered 2019-06-01 – 2019-06-02 (×2): 750 mg via ORAL
  Filled 2019-06-01 (×4): qty 3

## 2019-06-01 MED ORDER — DIVALPROEX SODIUM 250 MG PO DR TAB
250.0000 mg | DELAYED_RELEASE_TABLET | Freq: Every day | ORAL | Status: DC
  Administered 2019-06-02 – 2019-06-04 (×3): 250 mg via ORAL
  Filled 2019-06-01 (×5): qty 1

## 2019-06-01 NOTE — [facility] Group Notes (Signed)
[facility] Group Notes: (Clinical Social Work)   06/01/2019      Type of Therapy:  Group Therapy   Participation Level:  Did Not Attend - was invited both individually by [facility] and by overhead announcement, chose not to attend.   Selmer Dominion, LCSW 06/01/2019, 12:09 PM

## 2019-06-01 NOTE — Progress Notes (Signed)
   05/31/19 2200  Psych Admission Type (Psych Patients Only)  Admission Status Involuntary  Psychosocial Assessment  Patient Complaints Anxiety  Eye Contact Fair  Facial Expression Animated;Anxious;Worried  Affect Anxious;Preoccupied;Apprehensive  Speech Logical/coherent;Pressured;Rapid;Tangential  Interaction Assertive;Dominating  Motor Activity Other (Comment) (WNL)  Appearance/Hygiene In scrubs;Unremarkable  Behavior Characteristics Cooperative;Anxious  Mood Anxious;Suspicious;Preoccupied  Aggressive Behavior  Effect No apparent injury  Thought Process  Coherency Disorganized;Flight of ideas;Tangential;Loose associations  Content Blaming others;Paranoia;Preoccupation  Delusions Paranoid;Persecutory  Perception WDL  Hallucination None reported or observed  Judgment Poor  Confusion Mild  Danger to Self  Current suicidal ideation? Denies  Danger to Others  Danger to Others None reported or observed   Pt seen at med window. Pt still tangential, persecutory delusions and anxious about taking meds. Pt took his meds a little quicker this evening. Pt still writing everything down. Pt was pleased that med dosages had been reduced. Pt still wants to be "med free."

## 2019-06-01 NOTE — Progress Notes (Signed)
   06/01/19 1400  Psych Admission Type (Psych Patients Only)  Admission Status Involuntary  Psychosocial Assessment  Patient Complaints Anxiety  Eye Contact Fair  Facial Expression Animated;Anxious;Worried  Affect Anxious;Preoccupied;Apprehensive  Speech Logical/coherent;Pressured;Rapid;Tangential  Interaction Assertive;Dominating  Motor Activity Other (Comment) (WNL)  Appearance/Hygiene In scrubs;Unremarkable  Behavior Characteristics Cooperative;Anxious  Mood Anxious  Aggressive Behavior  Effect No apparent injury  Thought Process  Coherency Disorganized;Flight of ideas;Tangential;Loose associations  Content Blaming others;Paranoia;Preoccupation  Delusions Paranoid;Persecutory  Perception WDL  Hallucination None reported or observed  Judgment Poor  Confusion Mild  Danger to Self  Current suicidal ideation? Denies  Danger to Others  Danger to Others None reported or observed

## 2019-06-01 NOTE — [facility] Group Notes (Signed)
Adult Psychoeducational Group Note  Date:  06/01/2019 Time:  12:52 PM  Group Topic/Focus:  Goals Group:   The focus of this group is to help patients establish daily goals to achieve during treatment and discuss how the patient can incorporate goal setting into their daily lives to aide in recovery.  Participation Level:  Did Not Attend   Paulino Rily 06/01/2019, 12:52 PM

## 2019-06-01 NOTE — Progress Notes (Signed)
[facility] MD Progress Note  06/01/2019 12:57 PM Daniel Shannon  MRN:  [id number] Subjective: Patient is a 64 year old male with what appears to be first break bipolar mania who was admitted on 05/27/2019 secondary to more manic behaviors.  He had been seen as an outpatient, and was prescribed Seroquel as well as Depakote, but apparently had been noncompliant with medications.  Objective: Patient is seen and examined.  Patient is a 64 year old male with the above-stated past psychiatric history who is seen in follow-up.  He continues to be significantly manic.  From the moment that I walked on the unit at approximately 7:30 AM he was on the telephone for at least 4 hours.  I could hear him in the office and his speech was pressured, he was near delusional with his attempts to explain his behaviors to multiple people on the telephone.  Review of the electronic medical record revealed that he had been argumentative during the course of the hospitalization.  His wife called this a.m., and was relieved knowing that he was not going to be discharged today.  She was quite fearful given her interpretation of all of his manic symptoms.  I spent over 45 minutes with the patient today.  He is in a significant degree of denial of his illness.  He remains pressured, irritable, physically agitated, tangential.  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  He stated his sister is controlled with lithium, and "I do not have any of the same symptoms that she does.  We discussed options in his medications given his tremendous fears of side effects.  I have made some changes today, but given his present state I am not really sure that he will go forward with compliance.  His vital signs are stable, he is afebrile.  He slept 5.25 hours last night.  He denied suicidal or  homicidal ideation.  There is no evidence of auditory or visual hallucinations.  Principal Problem: <principal problem not specified> Diagnosis: Active Problems:   Bipolar disorder ([place])  Total Time spent with patient: 45 minutes  Past Psychiatric History: See admission H&P  Past Medical History: History reviewed. No pertinent past medical history. History reviewed. No pertinent surgical history. Family History: History reviewed. No pertinent family history. Family Psychiatric  History: See admission H&P Social History:  Social History   Substance and Sexual Activity  Alcohol Use Not Currently     Social History   Substance and Sexual Activity  Drug Use Not Currently    Social History   Socioeconomic History  . Marital status: Married    Spouse name: Not on file  . Number of children: Not on file  . Years of education: Not on file  . Highest education level: Not on file  Occupational History  . Not on file  Tobacco Use  . Smoking status: Never Smoker  . Smokeless tobacco: Never Used  Substance and Sexual Activity  . Alcohol use: Not Currently  . Drug use: Not Currently  . Sexual activity: Yes  Other Topics Concern  . Not on file  Social History Narrative  . Not on file   Social Determinants of Health   Financial Resource Strain:   . Difficulty of Paying Living Expenses: Not on file  Food Insecurity:   . Worried About Running  Out of Food in the Last Year: Not on file  . Ran Out of Food in the Last Year: Not on file  Transportation Needs:   . Lack of Transportation (Medical): Not on file  . Lack of Transportation (Non-Medical): Not on file  Physical Activity:   . Days of Exercise per Week: Not on file  . Minutes of Exercise per Session: Not on file  Stress:   . Feeling of Stress : Not on file  Social Connections:   . Frequency of Communication with Friends and Family: Not on file  . Frequency of Social Gatherings with Friends and Family: Not on file  .  Attends Religious Services: Not on file  . Active Member of Clubs or Organizations: Not on file  . Attends Archivist Meetings: Not on file  . Marital Status: Not on file   Additional Social History:    Pain Medications: See MAR Prescriptions: See MAR Over the Counter: See MAR History of alcohol / drug use?: No history of alcohol / drug abuse                    Sleep: Fair  Appetite:  Fair  Current Medications: Current Facility-Administered Medications  Medication Dose Route Frequency Provider Last Rate Last Admin  . cloNIDine (CATAPRES) tablet 0.1 mg  0.1 mg Oral Q4H PRN Johnn Hai, MD      . divalproex (DEPAKOTE) DR tablet 250 mg  250 mg Oral Q12H Johnn Hai, MD   250 mg at 06/01/19 1005  . LORazepam (ATIVAN) tablet 1 mg  1 mg Oral Q4H PRN Cobos, Myer Peer, MD   1 mg at 05/28/19 1046  . OLANZapine (ZYPREXA) injection 10 mg  10 mg Intramuscular TID PRN Johnn Hai, MD      . OLANZapine zydis (ZYPREXA) disintegrating tablet 10 mg  10 mg Oral QHS Johnn Hai, MD   10 mg at 05/31/19 2209  . temazepam (RESTORIL) capsule 30 mg  30 mg Oral QHS Johnn Hai, MD   30 mg at 05/31/19 2214  . tiaGABine (GABITRIL) tablet 4 mg  4 mg Oral QHS Johnn Hai, MD   4 mg at 05/31/19 2209    Lab Results: No results found for this or any previous visit (from the past 35 hour(s)).  Blood Alcohol level:  Lab Results  Component Value Date   ETH <10 [id number]    Metabolic Disorder Labs: No results found for: HGBA1C, MPG No results found for: PROLACTIN No results found for: CHOL, TRIG, HDL, CHOLHDL, VLDL, LDLCALC  Physical Findings: AIMS: Facial and Oral Movements Muscles of Facial Expression: None, normal Lips and Perioral Area: None, normal Jaw: None, normal Tongue: None, normal,Extremity Movements Upper (arms, wrists, hands, fingers): None, normal Lower (legs, knees, ankles, toes): None, normal, Trunk Movements Neck, shoulders, hips: None, normal, Overall  Severity Severity of abnormal movements (highest score from questions above): None, normal Incapacitation due to abnormal movements: None, normal Patient's awareness of abnormal movements (rate only patient's report): No Awareness, Dental Status Current problems with teeth and/or dentures?: No Does patient usually wear dentures?: No  CIWA:    COWS:     Musculoskeletal: Strength & Muscle Tone: within normal limits Gait & Station: normal Patient leans: N/A  Psychiatric Specialty Exam: Physical Exam  Nursing note and vitals reviewed. Constitutional: He is oriented to person, place, and time. He appears well-developed and well-nourished.  HENT:  Head: Normocephalic and atraumatic.  Respiratory: Effort normal.  Neurological: He is alert  and oriented to person, place, and time.    Review of Systems  Blood pressure 138/89, pulse 79, temperature 98 F (36.7 C), temperature source Oral, resp. rate 18, height 6\' 1"  (1.854 m), weight 86.2 kg, SpO2 100 %.Body mass index is 25.07 kg/m.  General Appearance: Disheveled  Eye Contact:  Fair  Speech:  Pressured  Volume:  Increased  Mood:  Anxious, Dysphoric and Irritable  Affect:  Labile  Thought Process:  Disorganized and Descriptions of Associations: Tangential  Orientation:  Full (Time, Place, and Person)  Thought Content:  Rumination and Tangential  Suicidal Thoughts:  No  Homicidal Thoughts:  No  Memory:  Immediate;   Fair Recent;   Fair Remote;   Fair  Judgement:  Impaired  Insight:  Lacking  Psychomotor Activity:  Increased  Concentration:  Concentration: Poor and Attention Span: Poor  Recall:  [place] of Knowledge:  Good  Language:  Good  Akathisia:  Negative  Handed:  Right  AIMS (if indicated):     Assets:  Desire for Improvement Financial Resources/Insurance Housing Resilience  ADL's:  Intact  Cognition:  WNL  Sleep:  Number of Hours: 5.25     Treatment Plan Summary: Daily contact with patient to assess and  evaluate symptoms and progress in treatment, Medication management and Plan : Patient is seen and examined.  Patient is a 64 year old male with the above-stated past psychiatric history who is seen in follow-up.   Diagnosis: #1 bipolar disorder, most recently manic, severe without psychotic features  Patient is seen in follow-up.  He has no insight to his current situation.  He is clearly manic.  He is clearly pressured, tangential, ruminating, not sleeping and labile.  I spent a great deal of time with him discussing his medications today.  At least in the office he has agreed to allow me to increase his Depakote.  I will change his Depakote to 250 mg p.o. daily and increase his evening dose to 750 mg.  I have also increase his Zyprexa to 15 mg p.o. nightly.  He did complain of some orthostatic type symptoms, and I will stop the clonidine and and as well to Gabitril at this point.  I have spoken to his wife today, and she was very uncomfortable with the idea that he might be coming home.  I assured her that I was not planning to discharge him anytime in the next 24 hours until I saw significant improvement.  I would like to get some imaging, but while he is so noncompliant and so lacking insight towards his illness that letting him be transported to the imaging center I am afraid he would try and make a break for it.  Again I spent over 45 minutes with the patient today in evaluation, and hopefully these changes will be beneficial.   1.  Change Depakote to 250 mg p.o. daily and 750 mg p.o. every afternoon for mood stability. 2.  Continue lorazepam 1 mg p.o. every 4 hours as needed anxiety or agitation. 3.  Increase Zyprexa to 15 mg p.o. nightly for mood stability. 4.  Continue Zyprexa 10 mg IM 3 times daily as needed agitation. 5.  Continue Restoril 30 mg p.o. nightly for insomnia. 6.  Stop Gabitril. 7.  Stop clonidine. 8.  Disposition planning-in progress.  Sharma Covert, MD 06/01/2019, 12:57  PM

## 2019-06-02 NOTE — [facility] Group Notes (Signed)
Adult Psychoeducational Group Note  Date:  06/02/2019 Time:  12:47 PM  Group Topic/Focus:  Progressive Relaxation; Progressive muscle relaxation, How to deep breathe and Visible Imagery.  Participation Level:  Active  Participation Quality:  Appropriate  Affect:  Flat  Cognitive:  Delusional  Insight: Lacking  Engagement in Group:  Poor  Modes of Intervention:  Activity  Additional Comments:  Pt was able to sit and go through the progressive relaxation with the group. Stated that he learned tht when he has trouble again he will not go to the hospital because "they put you here in this institution".  Daniel Shannon 06/02/2019, 12:47 PM

## 2019-06-02 NOTE — Progress Notes (Signed)
   06/02/19 2110  COVID-19 Daily Checkoff  Have you had a fever (temp > 37.80C/100F)  in the past 24 hours?  No  If you have had runny nose, nasal congestion, sneezing in the past 24 hours, has it worsened? No  COVID-19 EXPOSURE  Have you traveled outside the state in the past 14 days? No  Have you been in contact with someone with a confirmed diagnosis of COVID-19 or PUI in the past 14 days without wearing appropriate PPE? No  Have you been living in the same home as a person with confirmed diagnosis of COVID-19 or a PUI (household contact)? No  Have you been diagnosed with COVID-19? No

## 2019-06-02 NOTE — Progress Notes (Signed)
Adult Psychoeducational Group Note  Date:  06/02/2019 Time:  11:12 PM  Group Topic/Focus:  Wrap-Up Group:   The focus of this group is to help patients review their daily goal of treatment and discuss progress on daily workbooks.  Participation Level:  Active  Participation Quality:  Appropriate  Affect:  Appropriate  Cognitive:  Appropriate  Insight: Appropriate  Engagement in Group:  Engaged  Modes of Intervention:  Discussion  Additional Comments:  Patient did not attend group, but filled out the group sheet that was given him. Patient said that his goal was to be discharged. Patient said he felt depressed being here and he wasn't sure how to rate his day. He also said that he wasn't SI or HI and he will notify staff if he starts having those thoughts. Patient stated "I just need to be in my own home-in my own bed -and off all meds."  Osten Janek W Lyndzie Zentz [id number], 11:12 PM

## 2019-06-02 NOTE — Progress Notes (Signed)
   06/02/19 2110  Psych Admission Type (Psych Patients Only)  Admission Status Involuntary  Psychosocial Assessment  Patient Complaints Other (Comment) (pt reports that he should not be in this hospital)  Eye Contact Fair  Facial Expression Anxious;Worried  Affect Anxious;Preoccupied;Apprehensive  Speech Logical/coherent;Pressured;Rapid;Tangential  Interaction Assertive  Motor Activity Other (Comment) (WNL)  Appearance/Hygiene In scrubs  Behavior Characteristics Cooperative;Appropriate to situation;Anxious;Guarded  Mood Anxious;Pleasant  Aggressive Behavior  Effect No apparent injury  Thought Process  Coherency Disorganized  Content Blaming others;Preoccupation  Delusions Paranoid;Persecutory  Perception WDL  Hallucination None reported or observed  Judgment Poor  Confusion Mild  Danger to Self  Current suicidal ideation? Denies  Danger to Others  Danger to Others None reported or observed  Patient remains anxious and preoccupied with his medications but did not take a lengthy time discussing pros and cons to taking them tonight.

## 2019-06-02 NOTE — BHH Group Notes (Signed)
[facility] LCSW Group Therapy Note  Date/Time:  06/02/2019  11:00AM-12:00PM  Type of Therapy and Topic:  Group Therapy:  Music and Mood  Participation Level:  Did Not Attend   Description of Group: In this process group, members listened to a variety of genres of music and identified that different types of music evoke different responses.  Patients were encouraged to identify music that was soothing for them and music that was energizing for them.  Patients discussed how this knowledge can help with wellness and recovery in various ways including managing depression and anxiety as well as encouraging healthy sleep habits.    Therapeutic Goals: Patients will explore the impact of different varieties of music on mood Patients will verbalize the thoughts they have when listening to different types of music Patients will identify music that is soothing to them as well as music that is energizing to them Patients will discuss how to use this knowledge to assist in maintaining wellness and recovery Patients will explore the use of music as a coping skill  Summary of Patient Progress:  The patient was invited to group several times, at the beginning and later in the middle of group when he was in the hall.  He declined.  Therapeutic Modalities: Solution Focused Brief Therapy Activity   Selmer Dominion, LCSW

## 2019-06-02 NOTE — Progress Notes (Signed)
[facility] MD Progress Note  06/02/2019 1:20 PM Therman Trebing  MRN:  [id number] Subjective:  Patient is a 64 year old male with what appears to be first break bipolar mania who was admitted on 05/27/2019 secondary to more manic behaviors.  He had been seen as an outpatient, and was prescribed Seroquel as well as Depakote, but apparently had been noncompliant with medications.  Objective: Patient is seen and examined.  Patient is a 64 year old male with the above-stated past psychiatric history who is seen in follow-up.  He still remains manic, but is improving.  His pressured speech has decreased, and his tangentiality is somewhat better.  He has 7 notebooks filled with multiple issues since he was admitted.  His focus and concentration are so disturbed by his mental illness that he has to write down essentially everything.  He continues to be in denial with regard to his illness.  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  He did sleep better last night and got 6.25 hours of sleep last night.  He denied any auditory or visual hallucinations.  He denied any suicidal or homicidal ideation.  He has called his lawyer multiple times, and asked multiple questions with regard to his involuntary commitment.  He is unhappy about the fact that I will not address him by his first name and that he is unable to address me by my first name.  Principal Problem: <principal problem not specified> Diagnosis: Active Problems:   Bipolar disorder ([place])  Total Time spent with patient: 20 minutes  Past Psychiatric History: See admission H&P  Past Medical History: History reviewed. No pertinent past medical history. History reviewed. No pertinent surgical history. Family History: History reviewed. No pertinent family  history. Family Psychiatric  History: See admission H&P Social History:  Social History   Substance and Sexual Activity  Alcohol Use Not Currently     Social History   Substance and Sexual Activity  Drug Use Not Currently    Social History   Socioeconomic History  . Marital status: Married    Spouse name: Not on file  . Number of children: Not on file  . Years of education: Not on file  . Highest education level: Not on file  Occupational History  . Not on file  Tobacco Use  . Smoking status: Never Smoker  . Smokeless tobacco: Never Used  Substance and Sexual Activity  . Alcohol use: Not Currently  . Drug use: Not Currently  . Sexual activity: Yes  Other Topics Concern  . Not on file  Social History Narrative  . Not on file   Social Determinants of Health   Financial Resource Strain:   . Difficulty of Paying Living Expenses: Not on file  Food Insecurity:   . Worried About Charity fundraiser in the Last Year: Not on file  . Ran Out of Food in the Last Year: Not on file  Transportation Needs:   . Lack of Transportation (Medical): Not on file  . Lack of Transportation (Non-Medical): Not on file  Physical Activity:   . Days of Exercise per Week: Not on file  . Minutes of Exercise per Session: Not on file  Stress:   . Feeling of Stress : Not on file  Social Connections:   .  Frequency of Communication with Friends and Family: Not on file  . Frequency of Social Gatherings with Friends and Family: Not on file  . Attends Religious Services: Not on file  . Active Member of Clubs or Organizations: Not on file  . Attends Archivist Meetings: Not on file  . Marital Status: Not on file   Additional Social History:    Pain Medications: See MAR Prescriptions: See MAR Over the Counter: See MAR History of alcohol / drug use?: No history of alcohol / drug abuse                    Sleep: Fair  Appetite:  Fair  Current Medications: Current  Facility-Administered Medications  Medication Dose Route Frequency Provider Last Rate Last Admin  . divalproex (DEPAKOTE) DR tablet 250 mg  250 mg Oral Daily Sharma Covert, MD   250 mg at 06/02/19 0910  . divalproex (DEPAKOTE) DR tablet 750 mg  750 mg Oral QPM Sharma Covert, MD   750 mg at 06/01/19 1802  . LORazepam (ATIVAN) tablet 1 mg  1 mg Oral Q4H PRN Cobos, Myer Peer, MD   1 mg at 05/28/19 1046  . OLANZapine (ZYPREXA) injection 10 mg  10 mg Intramuscular TID PRN Johnn Hai, MD      . OLANZapine zydis (ZYPREXA) disintegrating tablet 15 mg  15 mg Oral QHS Sharma Covert, MD   15 mg at 06/01/19 2055  . temazepam (RESTORIL) capsule 30 mg  30 mg Oral QHS Johnn Hai, MD   30 mg at 06/01/19 2055    Lab Results:  Results for orders placed or performed during the hospital encounter of 05/27/19 (from the past 48 hour(s))  Urinalysis, Routine w reflex microscopic     Status: Abnormal   Collection Time: 06/01/19  6:09 PM  Result Value Ref Range   Color, Urine YELLOW YELLOW   APPearance CLEAR CLEAR   Specific Gravity, Urine 1.026 1.005 - 1.030   pH 5.0 5.0 - 8.0   Glucose, UA NEGATIVE NEGATIVE mg/dL   Hgb urine dipstick SMALL (A) NEGATIVE   Bilirubin Urine NEGATIVE NEGATIVE   Ketones, ur 20 (A) NEGATIVE mg/dL   Protein, ur NEGATIVE NEGATIVE mg/dL   Nitrite NEGATIVE NEGATIVE   Leukocytes,Ua NEGATIVE NEGATIVE   RBC / HPF 0-5 0 - 5 RBC/hpf   WBC, UA 0-5 0 - 5 WBC/hpf   Bacteria, UA NONE SEEN NONE SEEN   Mucus PRESENT     Comment: Performed at [facility], [place] [address] [place], [place] [address]    Blood Alcohol level:  Lab Results  Component Value Date   ETH <10 [id number]    Metabolic Disorder Labs: No results found for: HGBA1C, MPG No results found for: PROLACTIN No results found for: CHOL, TRIG, HDL, CHOLHDL, VLDL, LDLCALC  Physical Findings: AIMS: Facial and Oral Movements Muscles of Facial Expression: None, normal Lips and Perioral  Area: None, normal Jaw: None, normal Tongue: None, normal,Extremity Movements Upper (arms, wrists, hands, fingers): None, normal Lower (legs, knees, ankles, toes): None, normal, Trunk Movements Neck, shoulders, hips: None, normal, Overall Severity Severity of abnormal movements (highest score from questions above): None, normal Incapacitation due to abnormal movements: None, normal Patient's awareness of abnormal movements (rate only patient's report): No Awareness, Dental Status Current problems with teeth and/or dentures?: No Does patient usually wear dentures?: No  CIWA:    COWS:     Musculoskeletal: Strength & Muscle Tone: within normal  limits Gait & Station: normal Patient leans: N/A  Psychiatric Specialty Exam: Physical Exam  Nursing note and vitals reviewed. Constitutional: He is oriented to person, place, and time. He appears well-developed and well-nourished.  HENT:  Head: Normocephalic and atraumatic.  Respiratory: Effort normal.  Neurological: He is alert and oriented to person, place, and time.    Review of Systems  Blood pressure 138/89, pulse 79, temperature 98 F (36.7 C), temperature source Oral, resp. rate 18, height 6\' 1"  (1.854 m), weight 86.2 kg, SpO2 100 %.Body mass index is 25.07 kg/m.  General Appearance: Disheveled  Eye Contact:  Fair  Speech:  Pressured  Volume:  Increased  Mood:  Anxious, Dysphoric and Irritable  Affect:  Labile  Thought Process:  Disorganized and Descriptions of Associations: Tangential  Orientation:  Full (Time, Place, and Person)  Thought Content:  Rumination and Tangential  Suicidal Thoughts:  No  Homicidal Thoughts:  No  Memory:  Immediate;   Fair Recent;   Fair Remote;   Fair  Judgement:  Impaired  Insight:  Lacking  Psychomotor Activity:  Increased  Concentration:  Concentration: Fair and Attention Span: Fair  Recall:  [employer] of Knowledge:  Good  Language:  Good  Akathisia:  Negative  Handed:  Right  AIMS (if  indicated):     Assets:  Desire for Improvement Resilience  ADL's:  Intact  Cognition:  WNL  Sleep:  Number of Hours: 6.25     Treatment Plan Summary: Daily contact with patient to assess and evaluate symptoms and progress in treatment, Medication management and Plan : Patient is seen and examined.  Patient is a 64 year old male with the above-stated past psychiatric history who is seen in follow-up.  Diagnosis: #1 bipolar disorder, most recently manic, severe without psychotic features  Patient is seen in follow-up.  He is perhaps slightly better.  No changes medications today.  Hopefully as the Zyprexa and Depakote kicked in his mania will decrease.  His insight towards his current situation is limited.  1.  Continue Depakote to 250 mg p.o. daily and 750 mg p.o. every afternoon for mood stability. 2.  Continue lorazepam 1 mg p.o. every 4 hours as needed anxiety or agitation. 3.  Increase Zyprexa to 15 mg p.o. nightly for mood stability. 4.  Continue Zyprexa 10 mg IM 3 times daily as needed agitation. 5.  Continue Restoril 30 mg p.o. nightly for insomnia. 6.  Disposition planning-in progress.  Sharma Covert, MD 06/02/2019, 1:20 PM

## 2019-06-02 NOTE — Progress Notes (Addendum)
   06/02/19 1500  Psych Admission Type (Psych Patients Only)  Admission Status Involuntary  Psychosocial Assessment  Patient Complaints Anxiety  Eye Contact Fair  Facial Expression Anxious;Worried  Affect Anxious;Preoccupied;Apprehensive  Speech Logical/coherent;Pressured;Rapid;Tangential  Interaction Assertive  Motor Activity Other (Comment) (WNL)  Appearance/Hygiene In scrubs;Unremarkable  Behavior Characteristics Cooperative;Anxious  Mood Anxious  Aggressive Behavior  Effect No apparent injury  Thought Process  Coherency Disorganized  Content Blaming others;Preoccupation  Delusions Paranoid;Persecutory  Perception WDL  Hallucination None reported or observed  Judgment Poor  Confusion Mild  Danger to Self  Current suicidal ideation? Denies  Danger to Others  Danger to Others None reported or observed

## 2019-06-03 MED ORDER — OLANZAPINE 10 MG PO TBDP
20.0000 mg | ORAL_TABLET | Freq: Every day | ORAL | Status: DC
  Administered 2019-06-03: 20 mg via ORAL
  Filled 2019-06-03 (×3): qty 2

## 2019-06-03 MED ORDER — DIVALPROEX SODIUM 500 MG PO DR TAB
1000.0000 mg | DELAYED_RELEASE_TABLET | Freq: Every evening | ORAL | Status: DC
  Administered 2019-06-03: 1000 mg via ORAL
  Filled 2019-06-03 (×3): qty 2

## 2019-06-03 NOTE — Tx Team (Signed)
Interdisciplinary Treatment and Diagnostic Plan Update  06/03/2019 Time of Session: 11:22am Daniel Shannon MRN: [id number]  Principal Diagnosis: <principal problem not specified>  Secondary Diagnoses: Active Problems:   Bipolar disorder ([place])   Current Medications:  Current Facility-Administered Medications  Medication Dose Route Frequency Provider Last Rate Last Admin  . divalproex (DEPAKOTE) DR tablet 1,000 mg  1,000 mg Oral QPM Johnn Hai, MD      . divalproex (DEPAKOTE) DR tablet 250 mg  250 mg Oral Daily Sharma Covert, MD   250 mg at 06/03/19 0904  . LORazepam (ATIVAN) tablet 1 mg  1 mg Oral Q4H PRN Cobos, Myer Peer, MD   1 mg at 05/28/19 1046  . OLANZapine (ZYPREXA) injection 10 mg  10 mg Intramuscular TID PRN Johnn Hai, MD      . OLANZapine zydis (ZYPREXA) disintegrating tablet 20 mg  20 mg Oral QHS Johnn Hai, MD      . temazepam (RESTORIL) capsule 30 mg  30 mg Oral QHS Johnn Hai, MD   30 mg at 06/02/19 2108   PTA Medications: Medications Prior to Admission  Medication Sig Dispense Refill Last Dose  . cholecalciferol (VITAMIN D3) 25 MCG (1000 UNIT) tablet Take 1,000 Units by mouth daily.     . divalproex (DEPAKOTE ER) 500 MG 24 hr tablet Take 1,000 mg by mouth at bedtime.     Marland Kitchen QUEtiapine (SEROQUEL) 50 MG tablet Take 50 mg by mouth at bedtime.       Patient Stressors: Health problems Occupational concerns Traumatic event  Patient Strengths: Average or above average intelligence Capable of independent living Child psychotherapist Motivation for treatment/growth Physical Health  Treatment Modalities: Medication Management, Group therapy, Case management,  1 to 1 session with clinician, Psychoeducation, Recreational therapy.   Physician Treatment Plan for Primary Diagnosis: <principal problem not specified> Long Term Goal(s): Improvement in symptoms so as ready for discharge Improvement in symptoms so as ready for discharge   Short  Term Goals: Ability to identify changes in lifestyle to reduce recurrence of condition will improve Ability to identify changes in lifestyle to reduce recurrence of condition will improve  Medication Management: Evaluate patient's response, side effects, and tolerance of medication regimen.  Therapeutic Interventions: 1 to 1 sessions, Unit Group sessions and Medication administration.  Evaluation of Outcomes: Progressing  Physician Treatment Plan for Secondary Diagnosis: Active Problems:   Bipolar disorder ([place])  Long Term Goal(s): Improvement in symptoms so as ready for discharge Improvement in symptoms so as ready for discharge   Short Term Goals: Ability to identify changes in lifestyle to reduce recurrence of condition will improve Ability to identify changes in lifestyle to reduce recurrence of condition will improve     Medication Management: Evaluate patient's response, side effects, and tolerance of medication regimen.  Therapeutic Interventions: 1 to 1 sessions, Unit Group sessions and Medication administration.  Evaluation of Outcomes: Progressing   RN Treatment Plan for Primary Diagnosis: <principal problem not specified> Long Term Goal(s): Knowledge of disease and therapeutic regimen to maintain health will improve  Short Term Goals: Ability to participate in decision making will improve, Ability to verbalize feelings will improve, Ability to disclose and discuss suicidal ideas, Ability to identify and develop effective coping behaviors will improve and Compliance with prescribed medications will improve  Medication Management: RN will administer medications as ordered by provider, will assess and evaluate patient's response and provide education to patient for prescribed medication. RN will report any adverse and/or side effects to prescribing  provider.  Therapeutic Interventions: 1 on 1 counseling sessions, Psychoeducation, Medication administration, Evaluate responses  to treatment, Monitor vital signs and CBGs as ordered, Perform/monitor CIWA, COWS, AIMS and Fall Risk screenings as ordered, Perform wound care treatments as ordered.  Evaluation of Outcomes: Progressing   LCSW Treatment Plan for Primary Diagnosis: <principal problem not specified> Long Term Goal(s): Safe transition to appropriate next level of care at discharge, Engage patient in therapeutic group addressing interpersonal concerns.  Short Term Goals: Engage patient in aftercare planning with referrals and resources and Increase skills for wellness and recovery  Therapeutic Interventions: Assess for all discharge needs, 1 to 1 time with Social worker, Explore available resources and support systems, Assess for adequacy in community support network, Educate family and significant other(s) on suicide prevention, Complete Psychosocial Assessment, Interpersonal group therapy.  Evaluation of Outcomes: Progressing   Progress in Treatment: Attending groups: No. Participating in groups: No. Taking medication as prescribed: No. Toleration medication: No. Family/Significant other contact made: Yes, individual(s) contacted:  pt's wife Patient understands diagnosis: Yes. Discussing patient identified problems/goals with staff: Yes. Medical problems stabilized or resolved: Yes. Denies suicidal/homicidal ideation: Yes. Issues/concerns per patient self-inventory: No. Other:   New problem(s) identified: No, Describe:  None  New Short Term/Long Term Goal(s): Medication stabilization, elimination of SI thoughts, and development of a comprehensive mental wellness plan.   Patient Goals:    Discharge Plan or Barriers: Patient will be discharged home with his wife and follow up with his psychiatrist.   Reason for Continuation of Hospitalization: Mania Medication stabilization  Estimated Length of Stay: 1-2 days   Attendees: Patient: 06/03/2019   Physician:  06/03/2019   Nursing:  06/03/2019   RN  Care Manager: 06/03/2019   Social Worker: Ardelle Anton, LCSW 06/03/2019   Recreational Therapist:  06/03/2019  Other:  06/03/2019  Other:  06/03/2019  Other: 06/03/2019       Scribe for Treatment Team: Trecia Rogers, LCSW 06/03/2019 3:43 PM

## 2019-06-03 NOTE — [facility] Group Notes (Signed)
[facility] LCSW Group Therapy Note  Date/Time: 06/03/2019 @ 1:30pm  Type of Therapy and Topic:  Group Therapy:  Overcoming Obstacles  Participation Level:  [facility] PARTICIPATION LEVEL: Did Not Attend  Description of Group:    In this group patients will be encouraged to explore what they see as obstacles to their own wellness and recovery. They will be guided to discuss their thoughts, feelings, and behaviors related to these obstacles. The group will process together ways to cope with barriers, with attention given to specific choices patients can make. Each patient will be challenged to identify changes they are motivated to make in order to overcome their obstacles. This group will be process-oriented, with patients participating in exploration of their own experiences as well as giving and receiving support and challenge from other group members.  Therapeutic Goals: 1. Patient will identify personal and current obstacles as they relate to admission. 2. Patient will identify barriers that currently interfere with their wellness or overcoming obstacles.  3. Patient will identify feelings, thought process and behaviors related to these barriers. 4. Patient will identify two changes they are willing to make to overcome these obstacles:    Summary of Patient Progress   Patient was not in group therapy, today.    Therapeutic Modalities:   Cognitive Behavioral Therapy Solution Focused Therapy Motivational Interviewing Relapse Prevention Therapy   Ardelle Anton, LCSW

## 2019-06-03 NOTE — Progress Notes (Signed)
[facility] MD Progress Note  06/03/2019 11:00 AM Daniel Shannon  MRN:  [id number] Subjective:    Patient is finally showing some improvement, he is able to allow me to ask questions without interruption, he is continues to obsess about his status here and adamantly request discharge-generally self tormenting about his behavior and mania and asks about a diagnosis, asks typical questions about medication so forth.  Has progressed to a more hypomanic state speech is still rapid but this may be closer to baseline again is much improved but I do not think he is ready for discharge yet.   Principal Problem:  Diagnosis: Active Problems:   Bipolar disorder ([place])  Total Time spent with patient: 20 minutes  Past Psychiatric History: neg  Past Medical History: History reviewed. No pertinent past medical history. History reviewed. No pertinent surgical history. Family History: History reviewed. No pertinent family history. Family Psychiatric  History: as above  Social History:  Social History   Substance and Sexual Activity  Alcohol Use Not Currently     Social History   Substance and Sexual Activity  Drug Use Not Currently    Social History   Socioeconomic History  . Marital status: Married    Spouse name: Not on file  . Number of children: Not on file  . Years of education: Not on file  . Highest education level: Not on file  Occupational History  . Not on file  Tobacco Use  . Smoking status: Never Smoker  . Smokeless tobacco: Never Used  Substance and Sexual Activity  . Alcohol use: Not Currently  . Drug use: Not Currently  . Sexual activity: Yes  Other Topics Concern  . Not on file  Social History Narrative  . Not on file   Social Determinants of Health   Financial Resource Strain:   . Difficulty of Paying Living Expenses: Not on file  Food Insecurity:   . Worried About Charity fundraiser in the Last Year: Not on file  . Ran Out of Food in the Last Year: Not on file   Transportation Needs:   . Lack of Transportation (Medical): Not on file  . Lack of Transportation (Non-Medical): Not on file  Physical Activity:   . Days of Exercise per Week: Not on file  . Minutes of Exercise per Session: Not on file  Stress:   . Feeling of Stress : Not on file  Social Connections:   . Frequency of Communication with Friends and Family: Not on file  . Frequency of Social Gatherings with Friends and Family: Not on file  . Attends Religious Services: Not on file  . Active Member of Clubs or Organizations: Not on file  . Attends Archivist Meetings: Not on file  . Marital Status: Not on file   Additional Social History:    Pain Medications: See MAR Prescriptions: See MAR Over the Counter: See MAR History of alcohol / drug use?: No history of alcohol / drug abuse                    Sleep: Fair  Appetite:  Fair  Current Medications: Current Facility-Administered Medications  Medication Dose Route Frequency Provider Last Rate Last Admin  . divalproex (DEPAKOTE) DR tablet 250 mg  250 mg Oral Daily Sharma Covert, MD   250 mg at 06/03/19 0904  . divalproex (DEPAKOTE) DR tablet 750 mg  750 mg Oral QPM Sharma Covert, MD   750 mg at 06/02/19  2108  . LORazepam (ATIVAN) tablet 1 mg  1 mg Oral Q4H PRN Cobos, Myer Peer, MD   1 mg at 05/28/19 1046  . OLANZapine (ZYPREXA) injection 10 mg  10 mg Intramuscular TID PRN Johnn Hai, MD      . OLANZapine zydis (ZYPREXA) disintegrating tablet 15 mg  15 mg Oral QHS Sharma Covert, MD   15 mg at 06/02/19 2106  . temazepam (RESTORIL) capsule 30 mg  30 mg Oral QHS Johnn Hai, MD   30 mg at 06/02/19 2108    Lab Results:  Results for orders placed or performed during the hospital encounter of 05/27/19 (from the past 48 hour(s))  Urinalysis, Routine w reflex microscopic     Status: Abnormal   Collection Time: 06/01/19  6:09 PM  Result Value Ref Range   Color, Urine YELLOW YELLOW   APPearance  CLEAR CLEAR   Specific Gravity, Urine 1.026 1.005 - 1.030   pH 5.0 5.0 - 8.0   Glucose, UA NEGATIVE NEGATIVE mg/dL   Hgb urine dipstick SMALL (A) NEGATIVE   Bilirubin Urine NEGATIVE NEGATIVE   Ketones, ur 20 (A) NEGATIVE mg/dL   Protein, ur NEGATIVE NEGATIVE mg/dL   Nitrite NEGATIVE NEGATIVE   Leukocytes,Ua NEGATIVE NEGATIVE   RBC / HPF 0-5 0 - 5 RBC/hpf   WBC, UA 0-5 0 - 5 WBC/hpf   Bacteria, UA NONE SEEN NONE SEEN   Mucus PRESENT     Comment: Performed at [facility], [place] [address] [place], [place] [address]    Blood Alcohol level:  Lab Results  Component Value Date   ETH <10 [id number]    Metabolic Disorder Labs: No results found for: HGBA1C, MPG No results found for: PROLACTIN No results found for: CHOL, TRIG, HDL, CHOLHDL, VLDL, LDLCALC  Physical Findings: AIMS: Facial and Oral Movements Muscles of Facial Expression: None, normal Lips and Perioral Area: None, normal Jaw: None, normal Tongue: None, normal,Extremity Movements Upper (arms, wrists, hands, fingers): None, normal Lower (legs, knees, ankles, toes): None, normal, Trunk Movements Neck, shoulders, hips: None, normal, Overall Severity Severity of abnormal movements (highest score from questions above): None, normal Incapacitation due to abnormal movements: None, normal Patient's awareness of abnormal movements (rate only patient's report): No Awareness, Dental Status Current problems with teeth and/or dentures?: No Does patient usually wear dentures?: No  CIWA:    COWS:     Musculoskeletal: Strength & Muscle Tone: within normal limits Gait & Station: normal Patient leans: N/A  Psychiatric Specialty Exam: Physical Exam  Review of Systems  Blood pressure 138/89, pulse 79, temperature 98 F (36.7 C), temperature source Oral, resp. rate 18, height 6\' 1"  (1.854 m), weight 86.2 kg, SpO2 100 %.Body mass index is 25.07 kg/m.  General Appearance: Disheveled  Eye Contact:  Fair  Speech:   Pressured  Volume:  Normal  Mood:  Anxious  Affect:  Congruent  Thought Process:  Descriptions of Associations: Circumstantial  Orientation:  Full (Time, Place, and Person)  Thought Content:  Rumination  Suicidal Thoughts:  No  Homicidal Thoughts:  No  Memory:  Immediate;   Good Recent;   Good Remote;   Good  Judgement:  Fair  Insight:  Fair  Psychomotor Activity:  Normal  Concentration:  Concentration: Fair and Attention Span: Fair  Recall:  [employer] of Knowledge:  Fair  Language:  Fair  Akathisia:  Negative  Handed:  Right  AIMS (if indicated):     Assets:  Resilience Social Support  ADL's:  Intact  Cognition:  WNL  Sleep:  Number of Hours: 6.25   Treatment Plan Summary: Daily contact with patient to assess and evaluate symptoms and progress in treatment and Medication management  Escalate Depakote and escalate olanzapine 1 final adjustment hopefully prior to discharge also needs MRI at some point.  Discussed with family  Johnn Hai, MD 06/03/2019, 11:00 AM

## 2019-06-03 NOTE — Progress Notes (Signed)
Recreation Therapy Notes  Date: 2.15.21 Time: 1000 Location: 500 Hall Dayroom  Group Topic: Coping Skills  Goal Area(s) Addresses:  Patient will identify positive coping skills. Patient will identify benefit of using positive coping skills.  Intervention: Magazines, Architect paper, scissors, glue sticks  Activity: Coping Collage.  Patients were to use magazine to identify pictures of coping skills that could be used for diversions, social, cognitive, tension releasers and physical.  Patients were to find at least two positive coping skills for each.   Education: Radiographer, therapeutic, Dentist.   Education Outcome: Acknowledges understanding/In group clarification offered/Needs additional education.   Clinical Observations/Feedback: Pt did not attend group session.    Victorino Sparrow, LRT/CTRS         Victorino Sparrow A 06/03/2019 12:01 PM

## 2019-06-03 NOTE — Progress Notes (Signed)
   06/03/19 2113  Psych Admission Type (Psych Patients Only)  Admission Status Involuntary  Psychosocial Assessment  Patient Complaints Anxiety  Eye Contact Fair  Facial Expression Anxious;Worried  Affect Anxious  Speech Logical/coherent;Pressured  Interaction Assertive  Motor Activity Slow  Appearance/Hygiene In scrubs;Unremarkable  Behavior Characteristics Cooperative  Mood Pleasant  Aggressive Behavior  Effect No apparent injury  Thought Process  Coherency Disorganized  Content Blaming others;Preoccupation  Delusions Paranoid;Persecutory  Perception WDL  Hallucination None reported or observed  Judgment Poor  Confusion Mild  Danger to Self  Current suicidal ideation? Denies  Danger to Others  Danger to Others None reported or observed   Pt anxious at med window d/t the fact that his Depakote and Zyprexa were increased and he says he was not informed of this by the provider today. Says he would have objected to the increase if he had known. Pt took meds without incident.

## 2019-06-04 ENCOUNTER — Inpatient Hospital Stay (HOSPITAL_COMMUNITY): Payer: BC Managed Care – PPO

## 2019-06-04 DIAGNOSIS — F29 Unspecified psychosis not due to a substance or known physiological condition: Secondary | ICD-10-CM | POA: Diagnosis not present

## 2019-06-04 DIAGNOSIS — G9389 Other specified disorders of brain: Secondary | ICD-10-CM | POA: Diagnosis not present

## 2019-06-04 DIAGNOSIS — I10 Essential (primary) hypertension: Secondary | ICD-10-CM | POA: Diagnosis not present

## 2019-06-04 DIAGNOSIS — F312 Bipolar disorder, current episode manic severe with psychotic features: Secondary | ICD-10-CM | POA: Diagnosis not present

## 2019-06-04 MED ORDER — TEMAZEPAM 30 MG PO CAPS: 30.0000 mg | ORAL_CAPSULE | Freq: Every evening | ORAL | 1 refills | Status: DC | PRN

## 2019-06-04 MED ORDER — GADOBUTROL 1 MMOL/ML IV SOLN
9.0000 mL | Freq: Once | INTRAVENOUS | Status: AC | PRN
  Administered 2019-06-04: 16:00:00 9 mL via INTRAVENOUS
  Filled 2019-06-04: qty 10

## 2019-06-04 MED ORDER — DIVALPROEX SODIUM 250 MG PO DR TAB: DELAYED_RELEASE_TABLET | ORAL | 1 refills | Status: DC

## 2019-06-04 MED ORDER — PNEUMOCOCCAL VAC POLYVALENT 25 MCG/0.5ML IJ INJ
0.5000 mL | INJECTION | Freq: Once | INTRAMUSCULAR | Status: AC
  Administered 2019-06-04: 13:00:00 0.5 mL via INTRAMUSCULAR

## 2019-06-04 MED ORDER — OLANZAPINE 5 MG PO TABS: ORAL_TABLET | ORAL | 1 refills | Status: DC

## 2019-06-04 NOTE — BHH Suicide Risk Assessment (Signed)
[facility] Discharge Suicide Risk Assessment   Principal Problem: New onset behavioral issues Discharge Diagnoses: Active Problems:   Bipolar disorder ([place])   Total Time spent with patient: 45 minutes Musculoskeletal: Strength & Muscle Tone: within normal limits Gait & Station: normal Psychiatric Specialty Exam: Physical Exam  Review of Systems  Blood pressure 138/89, pulse 79, temperature 98 F (36.7 C), temperature source Oral, resp. rate 18, height 6\' 1"  (1.854 m), weight 86.2 kg, SpO2 100 %.Body mass index is 25.07 kg/m.  General Appearance: Casual  Eye Contact:  Good  Speech:  Clear and Coherent  Volume:  Normal  Mood:  Anxious  Affect:  Congruent  Thought Process:  Linear and Descriptions of Associations: Intact  Orientation:  Full (Time, Place, and Person)  Thought Content:  Logical  Suicidal Thoughts:  No  Homicidal Thoughts:  No  Memory:  Immediate;   Good Recent;   Good Remote;   Good  Judgement:  Good  Insight:  Good  Psychomotor Activity:  Normal  Concentration:  Concentration: Good and Attention Span: Good  Recall:  Good  Fund of Knowledge:  Good  Language:  Good  Akathisia:  Negative  Handed:  Right  AIMS (if indicated):     Assets:  Communication Skills Desire for Improvement Financial Resources/Insurance Housing Intimacy Leisure Time Physical Health Resilience Social Support Talents/Skills Transportation Vocational/Educational  ADL's:  Intact  Cognition:  WNL  Sleep:  Number of Hours: 7.5    Mental Status Per Nursing Assessment::   On Admission:  NA  Demographic Factors:  Male  Loss Factors: NA  Historical Factors: NA  Risk Reduction Factors:   Sense of responsibility to family, Religious beliefs about death, Employed, Living with another person, especially a relative, Positive social support, Positive therapeutic relationship and Positive coping skills or problem solving skills  Continued Clinical Symptoms:  Gaining  insight  Cognitive Features That Contribute To Risk:  None    Suicide Risk:  Minimal: No identifiable suicidal ideation.  Patients presenting with no risk factors but with morbid ruminations; may be classified as minimal risk based on the severity of the depressive symptoms    Plan Of Care/Follow-up recommendations:  Activity:  Take off work for a period of time  [place], MD 06/04/2019, 2:42 PM

## 2019-06-04 NOTE — Progress Notes (Signed)
Patient ID: Daniel Shannon, male   DOB: [birth year], 64 y.o.   MRN: [id number] Patient discharged to home/self care with the support of his family. Patient denies SI, HI and AVH.  Patient acknowledges understanding of all discharge instructions and receipt of personal belongings.

## 2019-06-04 NOTE — Progress Notes (Signed)
Recreation Therapy Notes  Date: 2.16.21 Time: 1000 Location: 500 Hall Dayroom  Group Topic: Self-Esteem  Goal Area(s) Addresses:  Patient will successfully identify positive attributes about themselves.  Patient will successfully identify benefit of improved self-esteem.   Intervention: Colored pencils, blank masks  Activity: How I See Me.  Patients were to design a blank mask to highlight some of the positive attributes about themselves.     Education:  Self-Esteem, Dentist.   Education Outcome: Acknowledges education/In group clarification offered/Needs additional education  Clinical Observations/Feedback: Pt did not attend group session.    Victorino Sparrow, LRT/CTRS         Victorino Sparrow A 06/04/2019 12:10 PM

## 2019-06-04 NOTE — Discharge Summary (Signed)
Physician Discharge Summary Note  Patient:  Daniel Shannon is an 64 y.o., male MRN:  [id number] DOB:  06-06-1955 Patient phone:  [phone] (home)  Patient address:   [place] [address],  Total Time spent with patient: 45 minutes  Date of Admission:  05/27/2019 Date of Discharge: 06/04/2019  Reason for Admission:    This was the first psychiatric admission for this 64 year old high functioning individual with no prior psychiatric history.  As discussed in the HPI, the patient had a several day history of atypical behaviors consistent with mania, seemingly propelled by days of insomnia and recent stressors.  Principal Problem: New onset behavioral issues Discharge Diagnoses: Active Problems:   Bipolar disorder [facility])   Past Psychiatric History: Negative/apart from an outpatient visit prior to his hospitalization in which quetiapine and Depakote were prescribed  Past Medical History: History reviewed. No pertinent past medical history. History reviewed. No pertinent surgical history. Family History: History reviewed. No pertinent family history. Family Psychiatric  History: Sister has a history of bipolar takes lithium Social History:  Social History   Substance and Sexual Activity  Alcohol Use Not Currently     Social History   Substance and Sexual Activity  Drug Use Not Currently    Social History   Socioeconomic History  . Marital status: Married    Spouse name: Not on file  . Number of children: Not on file  . Years of education: Not on file  . Highest education level: Not on file  Occupational History  . Not on file  Tobacco Use  . Smoking status: Never Smoker  . Smokeless tobacco: Never Used  Substance and Sexual Activity  . Alcohol use: Not Currently  . Drug use: Not Currently  . Sexual activity: Yes  Other Topics Concern  . Not on file  Social History Narrative  . Not on file   Social Determinants of Health   Financial Resource Strain:    . Difficulty of Paying Living Expenses: Not on file  Food Insecurity:   . Worried About Charity fundraiser in the Last Year: Not on file  . Ran Out of Food in the Last Year: Not on file  Transportation Needs:   . Lack of Transportation (Medical): Not on file  . Lack of Transportation (Non-Medical): Not on file  Physical Activity:   . Days of Exercise per Week: Not on file  . Minutes of Exercise per Session: Not on file  Stress:   . Feeling of Stress : Not on file  Social Connections:   . Frequency of Communication with Friends and Family: Not on file  . Frequency of Social Gatherings with Friends and Family: Not on file  . Attends Religious Services: Not on file  . Active Member of Clubs or Organizations: Not on file  . Attends Archivist Meetings: Not on file  . Marital Status: Not on file    Hospital Course:    The patient was admitted under routine precautions and never required a higher level of care, his initial stages in the hospital were marked by opposition to the involuntary nature of his admission, obsessiveness about his condition, insistence on documenting all conversations and requesting a scribe to do this so forth, and interviews very difficult initially.  The patient was prescribed olanzapine initially because it came in both injectable sublingual and oral forms, and Depakote was continued. Temazepam was used for sleep.  Though admitted on 2/8, by Friday 2/12 the medicine seemed  to catch up with him and he was oversedated during the day but still had some pressured speech racing thoughts so forth so I reduce the medication burden, and over the weekend he continued to have some symptoms.  By the date of the 15th we finally had a very good conversation, normal interview of appropriate interaction, the patient no longer obsessed with taking notes and so forth.  By the 16th he was again alert oriented to person place time situation no acute symptoms warranting  further inpatient care, however given the atypicality of his presentation we are going to order an MRI prior to discharge.  Unfortunately due to the scheduling it was bumped till 2:00 so is going on now at any rate we will add these findings later in an addendum if needed-  He will follow-up with Dr. Nicolasa Ducking tomorrow, he will continue on a tapering regimen of olanzapine and a continued regimen of Depakote. Certainly needs to take some time off work I think at least a month to recalibrate and feel like his old self   Physical Findings: AIMS: Facial and Oral Movements Muscles of Facial Expression: None, normal Lips and Perioral Area: None, normal Jaw: None, normal Tongue: None, normal,Extremity Movements Upper (arms, wrists, hands, fingers): None, normal Lower (legs, knees, ankles, toes): None, normal, Trunk Movements Neck, shoulders, hips: None, normal, Overall Severity Severity of abnormal movements (highest score from questions above): None, normal Incapacitation due to abnormal movements: None, normal Patient's awareness of abnormal movements (rate only patient's report): No Awareness, Dental Status Current problems with teeth and/or dentures?: No Does patient usually wear dentures?: No  CIWA:    COWS:     Musculoskeletal: Strength & Muscle Tone: within normal limits Gait & Station: normal Psychiatric Specialty Exam: Physical Exam  Review of Systems  Blood pressure 138/89, pulse 79, temperature 98 F (36.7 C), temperature source Oral, resp. rate 18, height 6\' 1"  (1.854 m), weight 86.2 kg, SpO2 100 %.Body mass index is 25.07 kg/m.  General Appearance: Casual  Eye Contact:  Good  Speech:  Clear and Coherent  Volume:  Normal  Mood:  Anxious  Affect:  Congruent  Thought Process:  Linear and Descriptions of Associations: Intact  Orientation:  Full (Time, Place, and Person)  Thought Content:  Logical  Suicidal Thoughts:  No  Homicidal Thoughts:  No  Memory:  Immediate;    Good Recent;   Good Remote;   Good  Judgement:  Good  Insight:  Good  Psychomotor Activity:  Normal  Concentration:  Concentration: Good and Attention Span: Good  Recall:  Good  Fund of Knowledge:  Good  Language:  Good  Akathisia:  Negative  Handed:  Right  AIMS (if indicated):     Assets:  Communication Skills Desire for Improvement Financial Resources/Insurance Housing Intimacy Leisure Time Physical Health Resilience Social Support Talents/Skills Transportation Vocational/Educational  ADL's:  Intact  Cognition:  WNL  Sleep:  Number of Hours: 7.5        Has this patient used any form of tobacco in the last 30 days? (Cigarettes, Smokeless Tobacco, Cigars, and/or Pipes) Yes, No  Blood Alcohol level:  Lab Results  Component Value Date   ETH <10 [id number]    Metabolic Disorder Labs:  No results found for: HGBA1C, MPG No results found for: PROLACTIN No results found for: CHOL, TRIG, HDL, CHOLHDL, VLDL, LDLCALC  See Psychiatric Specialty Exam and Suicide Risk Assessment completed by Attending Physician prior to discharge.  Discharge destination:  Home  Is  patient on multiple antipsychotic therapies at discharge:  No   Has Patient had three or more failed trials of antipsychotic monotherapy by history:  No  Recommended Plan for Multiple Antipsychotic Therapies: NA   Allergies as of 06/04/2019   No Known Allergies     Medication List    STOP taking these medications   divalproex 500 MG 24 hr tablet Commonly known as: DEPAKOTE ER Replaced by: divalproex 250 MG DR tablet   QUEtiapine 50 MG tablet Commonly known as: SEROQUEL     TAKE these medications     Indication  cholecalciferol 25 MCG (1000 UNIT) tablet Commonly known as: VITAMIN D3 Take 1,000 Units by mouth daily.  Indication: Vitamin D Deficiency   divalproex 250 MG DR tablet Commonly known as: DEPAKOTE 1 in am 3 at hs Replaces: divalproex 500 MG 24 hr tablet  Indication: Manic Phase of  Manic-Depression   OLANZapine 5 MG tablet Commonly known as: ZyPREXA 3 at hs x 2 days, 2 at hs then on  Indication: Manic Phase of Manic-Depression   temazepam 30 MG capsule Commonly known as: RESTORIL Take 1 capsule (30 mg total) by mouth at bedtime as needed for sleep.  Indication: Trouble Sleeping       Signed: Johnn Hai, MD 06/04/2019, 2:35 PM

## 2019-06-04 NOTE — Progress Notes (Signed)
  [facility] Adult Case Management Discharge Plan :  Will you be returning to the same living situation after discharge:  Yes,  home. At discharge, do you have transportation home?: Yes,  spouse will pick up. Do you have the ability to pay for your medications: Yes,  [employer] insurance.  Release of information consent forms completed and in the chart;  Patient's signature needed at discharge.  Patient to Follow up at: Follow-up Information    Patient and family will coordinate follow up. Follow up.           Next level of care provider has access to [facility] and Suicide Prevention discussed: Yes,  with spouse.   Has patient been referred to the Quitline?: N/A patient is not a smoker  Patient has been referred for addiction treatment: [place], [place] 06/04/2019, 4:07 PM

## 2019-06-04 NOTE — Progress Notes (Signed)
[facility] called about MRI for this pt. Tentatively scheduled at 1400 today. Please have MD speak to pt about whether he will consent to the procedure. Tech informed this Probation officer that the procedure will take about 45 minutes. Will inform day shift RN of this.

## 2019-06-05 DIAGNOSIS — F39 Unspecified mood [affective] disorder: Secondary | ICD-10-CM | POA: Diagnosis not present

## 2019-06-05 DIAGNOSIS — F5105 Insomnia due to other mental disorder: Secondary | ICD-10-CM | POA: Diagnosis not present

## 2019-06-07 DIAGNOSIS — F5105 Insomnia due to other mental disorder: Secondary | ICD-10-CM | POA: Diagnosis not present

## 2019-06-07 DIAGNOSIS — F39 Unspecified mood [affective] disorder: Secondary | ICD-10-CM | POA: Diagnosis not present

## 2019-06-07 DIAGNOSIS — F309 Manic episode, unspecified: Secondary | ICD-10-CM | POA: Diagnosis not present

## 2019-06-11 DIAGNOSIS — R7303 Prediabetes: Secondary | ICD-10-CM | POA: Diagnosis not present

## 2019-06-11 DIAGNOSIS — G319 Degenerative disease of nervous system, unspecified: Secondary | ICD-10-CM | POA: Diagnosis not present

## 2019-06-11 DIAGNOSIS — Z1331 Encounter for screening for depression: Secondary | ICD-10-CM | POA: Diagnosis not present

## 2019-06-11 DIAGNOSIS — F309 Manic episode, unspecified: Secondary | ICD-10-CM | POA: Diagnosis not present

## 2019-06-12 DIAGNOSIS — F5105 Insomnia due to other mental disorder: Secondary | ICD-10-CM | POA: Diagnosis not present

## 2019-06-12 DIAGNOSIS — F39 Unspecified mood [affective] disorder: Secondary | ICD-10-CM | POA: Diagnosis not present

## 2019-06-18 DIAGNOSIS — F39 Unspecified mood [affective] disorder: Secondary | ICD-10-CM | POA: Diagnosis not present

## 2019-06-18 DIAGNOSIS — F5105 Insomnia due to other mental disorder: Secondary | ICD-10-CM | POA: Diagnosis not present

## 2019-06-19 ENCOUNTER — Ambulatory Visit: Payer: BC Managed Care – PPO | Admitting: Diagnostic Neuroimaging

## 2019-06-20 ENCOUNTER — Ambulatory Visit (INDEPENDENT_AMBULATORY_CARE_PROVIDER_SITE_OTHER): Payer: BC Managed Care – PPO | Admitting: Neurology

## 2019-06-20 ENCOUNTER — Encounter: Payer: Self-pay | Admitting: Neurology

## 2019-06-20 ENCOUNTER — Other Ambulatory Visit: Payer: Self-pay

## 2019-06-20 DIAGNOSIS — F309 Manic episode, unspecified: Secondary | ICD-10-CM

## 2019-06-20 NOTE — Progress Notes (Signed)
Reason for visit: Manic event  Referring physician: Dr. Josie Saunders is a 64 y.o. male  History of present illness:  Daniel Shannon is a 64 year old right-handed white male without significant past medical history.  The patient apparently was admitted to the hospital for psychiatric therapy on 27 May 2019.  The week prior to this, he has been helping all his brother in [place], [place].  His brother was under a lot of stress with his office, and this resulted in some anxiety for the patient.  He indicates that he began waking up frequently at night to take notes.  He was getting very little sleep because of this.  His wife indicated that one night he got up 18 times to take these notes.  The patient at 1 point walked down the street to the [place] who is a neighbor and knocked on the door as he was concerned about an issue with his office.  The patient was taken to the hospital on 27 May 2019 as the family became concerned about his behavior.  The patient was not sleeping well.  The patient was discharged from the hospital around the 15th of February, he was treated with Zyprexa and was placed on Depakote.  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  Within the last several days, he was taken off the Zyprexa, he is planning on going back to work next week.  He remains on 1000 mg daily of Depakote and he seems to be tolerating this fairly well.  Depakote dosing at night does make him drowsy and he seems to rest better.  He has Restoril at night if needed.  He denies any previous episodes of mania, he does indicate that his sister is treated for bipolar disorder.  The patient denies any prior history of unusual behavior, he indicates that he does not have any OCD tendencies.  He has not been on any stimulant medications, he does not drink alcohol.  He denies use of any other illicit drugs.  The patient claims  that he feels back to normal at this time. He denies that he ever had any hallucinations, either visual or auditory during the manic episode.  He denies any prior history of memory issues or difficulty with managing activities of daily living.  He is able to operate a motor vehicle without difficulty, he keeps up with medications and appointments well.  He has not had any problems making errors while working.  The patient did undergo a MRI of the brain that did show some cortical atrophy.  No significant white matter changes were seen.  The patient is sent to this office for further evaluation.  History reviewed. No pertinent past medical history.  Past Surgical History:  Procedure Laterality Date  . SHOULDER ARTHROSCOPY Bilateral 2004, 2012    Family History  Problem Relation Age of Onset  . Stroke Mother   . Diabetes Brother     Social history:  reports that he has never smoked. He has never used smokeless tobacco. He reports previous alcohol use. He reports previous drug use.  Medications:  Prior to Admission medications   Medication Sig Start Date End Date Taking? Authorizing Provider  cholecalciferol (VITAMIN D3) 25 MCG (1000 UNIT) tablet Take 1,000 Units by mouth daily.   Yes [provider]  divalproex (DEPAKOTE) 250 MG DR tablet 1 in am 3  at hs 06/04/19  Yes Johnn Hai, MD  temazepam (RESTORIL) 30 MG capsule Take 1 capsule (30 mg total) by mouth at bedtime as needed for sleep. 06/04/19  Yes Johnn Hai, MD  OLANZapine (ZYPREXA) 5 MG tablet 3 at hs x 2 days, 2 at hs then on Patient not taking: Reported on 06/20/2019 06/04/19   Johnn Hai, MD     No Known Allergies  ROS:  Out of a complete 14 system review of symptoms, the patient complains only of the following symptoms, and all other reviewed systems are negative.  Transient mania episode  Blood pressure 131/81, pulse 69, temperature (!) 97 F (36.1 C), height 6\' 2"  (1.88 m), weight 202 lb (91.6 kg).  Physical  Exam  General: The patient is alert and cooperative at the time of the examination.  Eyes: Pupils are equal, round, and reactive to light. Discs are flat bilaterally.  Neck: The neck is supple, no carotid bruits are noted.  Respiratory: The respiratory examination is clear.  Cardiovascular: The cardiovascular examination reveals a regular rate and rhythm, no obvious murmurs or rubs are noted.  Skin: Extremities are without significant edema.  Neurologic Exam  Mental status: The patient is alert and oriented x 3 at the time of the examination. The patient has apparent normal recent and remote memory, with an apparently normal attention span and concentration ability.  Cranial nerves: Facial symmetry is present. There is good sensation of the face to pinprick and soft touch bilaterally. The strength of the facial muscles and the muscles to head turning and shoulder shrug are normal bilaterally. Speech is well enunciated, no aphasia or dysarthria is noted. Extraocular movements are full. Visual fields are full. The tongue is midline, and the patient has symmetric elevation of the soft palate. No obvious hearing deficits are noted.  Motor: The motor testing reveals 5 over 5 strength of all 4 extremities. Good symmetric motor tone is noted throughout.  Sensory: Sensory testing is intact to pinprick, soft touch, vibration sensation, and position sense on all 4 extremities. No evidence of extinction is noted.  Coordination: Cerebellar testing reveals good finger-nose-finger and heel-to-shin bilaterally.  Gait and station: Gait is normal. Tandem gait is normal. Romberg is negative. No drift is seen.  Reflexes: Deep tendon reflexes are symmetric, but are depressed bilaterally. Toes are downgoing bilaterally.   MRI brain 06/04/19:  IMPRESSION: 1. No white matter lesion identified to suggest demyelinating disease. 2. Moderate parenchymal volume loss.  * MRI scan images were reviewed  online. I agree with the written report.    Assessment/Plan:  1.  Transient episode of mania  The patient does have a family history of bipolar disorder but he personally has no history consistent with this.  The actual diagnosis of bipolar disorder with a first-time event of mania at age 17 would be difficult.  The patient may have had an episode of organic mania associated with stress, rather than a true bipolar disorder.  There is no history that would suggest a developing underlying cognitive issue or frontotemporal dementia.  I see no indication for further neurologic evaluation at this time, I would follow the patient conservatively.  If any change in cognitive functioning or behavior is noted, I will be happy to see the patient back.  I feel confident he should be able to return to work without further issue.  Jill Alexanders MD 06/20/2019 3:15 PM  [facility] [address] [place], [place] [address]  Phone [phone]  Fax [phone]

## 2019-07-02 DIAGNOSIS — F5105 Insomnia due to other mental disorder: Secondary | ICD-10-CM | POA: Diagnosis not present

## 2019-07-02 DIAGNOSIS — F39 Unspecified mood [affective] disorder: Secondary | ICD-10-CM | POA: Diagnosis not present

## 2019-07-17 DIAGNOSIS — F5105 Insomnia due to other mental disorder: Secondary | ICD-10-CM | POA: Diagnosis not present

## 2019-07-17 DIAGNOSIS — F39 Unspecified mood [affective] disorder: Secondary | ICD-10-CM | POA: Diagnosis not present

## 2019-08-14 DIAGNOSIS — F39 Unspecified mood [affective] disorder: Secondary | ICD-10-CM | POA: Diagnosis not present

## 2019-08-14 DIAGNOSIS — F5105 Insomnia due to other mental disorder: Secondary | ICD-10-CM | POA: Diagnosis not present

## 2019-10-09 DIAGNOSIS — F5105 Insomnia due to other mental disorder: Secondary | ICD-10-CM | POA: Diagnosis not present

## 2019-10-09 DIAGNOSIS — F39 Unspecified mood [affective] disorder: Secondary | ICD-10-CM | POA: Diagnosis not present

## 2019-11-13 DIAGNOSIS — F5105 Insomnia due to other mental disorder: Secondary | ICD-10-CM | POA: Diagnosis not present

## 2019-11-13 DIAGNOSIS — F39 Unspecified mood [affective] disorder: Secondary | ICD-10-CM | POA: Diagnosis not present

## 2019-12-18 DIAGNOSIS — F39 Unspecified mood [affective] disorder: Secondary | ICD-10-CM | POA: Diagnosis not present

## 2019-12-18 DIAGNOSIS — F5105 Insomnia due to other mental disorder: Secondary | ICD-10-CM | POA: Diagnosis not present

## 2019-12-24 DIAGNOSIS — R7303 Prediabetes: Secondary | ICD-10-CM | POA: Diagnosis not present

## 2019-12-24 DIAGNOSIS — Z79899 Other long term (current) drug therapy: Secondary | ICD-10-CM | POA: Diagnosis not present

## 2019-12-24 DIAGNOSIS — Z0000 Encounter for general adult medical examination without abnormal findings: Secondary | ICD-10-CM | POA: Diagnosis not present

## 2019-12-24 DIAGNOSIS — Z125 Encounter for screening for malignant neoplasm of prostate: Secondary | ICD-10-CM | POA: Diagnosis not present

## 2019-12-25 DIAGNOSIS — F309 Manic episode, unspecified: Secondary | ICD-10-CM | POA: Diagnosis not present

## 2019-12-25 DIAGNOSIS — R82998 Other abnormal findings in urine: Secondary | ICD-10-CM | POA: Diagnosis not present

## 2019-12-25 DIAGNOSIS — Z0000 Encounter for general adult medical examination without abnormal findings: Secondary | ICD-10-CM | POA: Diagnosis not present

## 2019-12-26 DIAGNOSIS — Z1212 Encounter for screening for malignant neoplasm of rectum: Secondary | ICD-10-CM | POA: Diagnosis not present

## 2020-03-18 DIAGNOSIS — F5105 Insomnia due to other mental disorder: Secondary | ICD-10-CM | POA: Diagnosis not present

## 2020-03-18 DIAGNOSIS — F39 Unspecified mood [affective] disorder: Secondary | ICD-10-CM | POA: Diagnosis not present

## 2020-07-31 ENCOUNTER — Ambulatory Visit (HOSPITAL_BASED_OUTPATIENT_CLINIC_OR_DEPARTMENT_OTHER)
Admission: RE | Admit: 2020-07-31 | Discharge: 2020-07-31 | Disposition: A | Discharge status: Home / Self Care | Payer: Self-pay | Source: Ambulatory Visit | Attending: Cardiology | Admitting: Cardiology

## 2020-07-31 ENCOUNTER — Other Ambulatory Visit: Payer: Self-pay

## 2020-07-31 ENCOUNTER — Other Ambulatory Visit (HOSPITAL_COMMUNITY): Payer: Self-pay | Admitting: *Deleted

## 2020-07-31 ENCOUNTER — Other Ambulatory Visit (HOSPITAL_BASED_OUTPATIENT_CLINIC_OR_DEPARTMENT_OTHER): Payer: Self-pay | Admitting: Cardiology

## 2020-08-27 ENCOUNTER — Other Ambulatory Visit: Payer: Self-pay

## 2020-08-27 ENCOUNTER — Telehealth: Payer: Self-pay | Admitting: Cardiovascular Disease

## 2020-08-27 DIAGNOSIS — Z79899 Other long term (current) drug therapy: Secondary | ICD-10-CM

## 2020-08-27 NOTE — Telephone Encounter (Signed)
Patient returning call to [facility].

## 2020-08-27 NOTE — Telephone Encounter (Signed)
Spoke with pt and scheduled an appointment on 5/19 per Dr. Claiborne Billings.

## 2020-08-28 ENCOUNTER — Other Ambulatory Visit: Payer: Self-pay | Admitting: Cardiovascular Disease

## 2020-08-28 ENCOUNTER — Other Ambulatory Visit: Payer: Self-pay

## 2020-08-28 DIAGNOSIS — Z79899 Other long term (current) drug therapy: Secondary | ICD-10-CM | POA: Diagnosis not present

## 2020-08-28 LAB — COMPREHENSIVE METABOLIC PANEL
ALT: 17 IU/L (ref 0–44)
AST: 19 IU/L (ref 0–40)
Albumin/Globulin Ratio: 2 (ref 1.2–2.2)
Albumin: 4.1 g/dL (ref 3.8–4.8)
Alkaline Phosphatase: 62 IU/L (ref 44–121)
BUN/Creatinine Ratio: 18 (ref 10–24)
BUN: 22 mg/dL (ref 8–27)
Bilirubin Total: 0.9 mg/dL (ref 0.0–1.2)
CO2: 23 mmol/L (ref 20–29)
Calcium: 9.2 mg/dL (ref 8.6–10.2)
Chloride: 103 mmol/L (ref 96–106)
Creatinine, Ser: 1.22 mg/dL (ref 0.76–1.27)
Globulin, Total: 2.1 g/dL (ref 1.5–4.5)
Glucose: 88 mg/dL (ref 65–99)
Potassium: 4.8 mmol/L (ref 3.5–5.2)
Sodium: 140 mmol/L (ref 134–144)
Total Protein: 6.2 g/dL (ref 6.0–8.5)
eGFR: 65 mL/min/{1.73_m2} (ref >59)

## 2020-08-28 LAB — LIPID PANEL
Chol/HDL Ratio: 2.7 ratio (ref 0.0–5.0)
Cholesterol, Total: 166 mg/dL (ref 100–199)
HDL: 62 mg/dL (ref >39)
LDL Chol Calc (NIH): 94 mg/dL (ref 0–99)
Triglycerides: 51 mg/dL (ref 0–149)
VLDL Cholesterol Cal: 10 mg/dL (ref 5–40)

## 2020-08-28 LAB — TSH: TSH: 1.98 u[IU]/mL (ref 0.450–4.500)

## 2020-09-01 LAB — CBC
Hematocrit: 41.3 % (ref 37.5–51.0)
Hemoglobin: 13.6 g/dL (ref 13.0–17.7)
MCH: 31.6 pg (ref 26.6–33.0)
MCHC: 32.9 g/dL (ref 31.5–35.7)
MCV: 96 fL (ref 79–97)
Platelets: 173 10*3/uL (ref 150–450)
RBC: 4.31 x10E6/uL (ref 4.14–5.80)
RDW: 12.6 % (ref 11.6–15.4)
WBC: 5.7 10*3/uL (ref 3.4–10.8)

## 2020-09-03 ENCOUNTER — Other Ambulatory Visit: Payer: Self-pay

## 2020-09-03 ENCOUNTER — Ambulatory Visit: Payer: PPO | Admitting: Cardiovascular Disease

## 2020-09-03 ENCOUNTER — Encounter: Payer: Self-pay | Admitting: Cardiovascular Disease

## 2020-09-03 DIAGNOSIS — R931 Abnormal findings on diagnostic imaging of heart and coronary circulation: Secondary | ICD-10-CM

## 2020-09-03 DIAGNOSIS — E785 Hyperlipidemia, unspecified: Secondary | ICD-10-CM | POA: Diagnosis not present

## 2020-09-03 MED ORDER — ROSUVASTATIN CALCIUM 20 MG PO TABS: 20.0000 mg | ORAL_TABLET | Freq: Every day | ORAL | 3 refills | Status: DC

## 2020-09-03 MED ORDER — ASPIRIN EC 81 MG PO TBEC: 81.0000 mg | DELAYED_RELEASE_TABLET | Freq: Every day | ORAL | 3 refills | Status: AC

## 2020-09-03 NOTE — Progress Notes (Signed)
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  He is followed by Dr. Lang Snow for primary care.  Currently he is not on any medications.  He exercises regularly and often rides a bike for an hour, runs or swims.  He remains asymptomatic and has not noticed any decline in exercise tolerance.  Review of laboratory from 2018 reveals a total cholesterol 178, triglycerides 87, HDL 65, and LDL 95.  In March 2019, total cholesterol was 180, triglyceride 67, HDL 60, and LDL 107.  In 2021, total cholesterol was 174, triglycerides 46, HDL 53, and LDL 113.  As part of Dr.'s day, he underwent coronary calcium scoring.  Calcium score was elevated at 364, representing 75th percentile for age, race, and sex matched control.  The majority of the calcification was in the left anterior descending artery with a score of 326 Agatston units.  The left circumflex coronary artery was 35.7 and RCA 2.14.  He had called me once he had received these results.  He was somewhat concerned.  He again reiterates his complete asymptomatic status despite pretty significant activity.  He presents for evaluation and discussion.   Past Surgical History:  Procedure Laterality Date  . SHOULDER ARTHROSCOPY Bilateral 2004, 2012    Current Medications: Outpatient Medications Prior to Visit  Medication Sig Dispense Refill  . cholecalciferol (VITAMIN D3) 25 MCG (1000 UNIT) tablet Take 1,000 Units by mouth daily.    . divalproex (DEPAKOTE) 250 MG DR tablet 1 in am 3 at hs 120 tablet 1  . temazepam (RESTORIL) 30 MG capsule Take 1 capsule (30 mg total) by mouth at bedtime as needed for sleep. 30 capsule 1   No facility-administered  medications prior to visit.     Allergies:   Patient has no known allergies.   Social History   Socioeconomic History  . Marital status: Married    Spouse name: Mardene Celeste  . Number of children: 3  . Years of education: Not on file  . Highest education level: Not on file  Occupational History  . Not on file  Tobacco Use  . Smoking status: Never Smoker  . Smokeless tobacco: Never Used  Vaping Use  . Vaping Use: Never used  Substance and Sexual Activity  . Alcohol use: Not Currently  . Drug use: Not Currently  . Sexual activity: Yes  Other Topics Concern  . Not on file  Social History Narrative   Lives with wife   Social Determinants of Health   Financial Resource Strain: Not on file  Food Insecurity: Not on file  Transportation Needs: Not on file  Physical Activity: Not on file  Stress: Not on file  Social Connections: Not on file    Socially he is married for 40 years.  He has 3 children ages 23, 67 and 82 and 7 grandchildren.  He is originally from [place].  He is retired [employer] in December 2021.  He does not drink alcohol.  He exercises at least 6 days/week anywhere from 1 to 2 hours at a time running, biking or swimming.  Family History:  The patient's family history includes Diabetes in his brother; Stroke in  his mother.  Mother died at age 64.  Father is currently living at 23 and remains very active.  He has 2 brothers, both physicians ages 52 and 45 and a sister age 43.   ROS General: Negative; No fevers, chills, or night sweats;  HEENT: Negative; No changes in vision or hearing, sinus congestion, difficulty swallowing Pulmonary: Negative; No cough, wheezing, shortness of breath, hemoptysis Cardiovascular: Negative; No chest pain, presyncope, syncope, palpitations GI: Negative; No nausea, vomiting, diarrhea, or abdominal pain GU: Negative; No dysuria, hematuria, or difficulty voiding Musculoskeletal: Negative; no myalgias, joint pain, or  weakness Hematologic/Oncology: Negative; no easy bruising, bleeding Endocrine: Negative; no heat/cold intolerance; no diabetes Neuro: Negative; no changes in balance, headaches Skin: Negative; No rashes or skin lesions Sleep: Negative; No snoring, daytime sleepiness, hypersomnolence, bruxism, restless legs, hypnogognic hallucinations, no cataplexy Other comprehensive 14 point system review is negative.   PHYSICAL EXAM:   VS:  BP 128/76   Pulse 65   Ht 6' 2"  (1.88 m)   Wt 197 lb (89.4 kg)   SpO2 98%   BMI 25.29 kg/m      Wt Readings from Last 3 Encounters:  09/03/20 197 lb (89.4 kg)  06/20/19 202 lb (91.6 kg)  05/27/19 195 lb (88.5 kg)    General: Alert, oriented, no distress.  Skin: normal turgor, no rashes, warm and dry HEENT: Normocephalic, atraumatic. Pupils equal round and reactive to light; sclera anicteric; extraocular muscles intact;  Nose without nasal septal hypertrophy Mouth/Parynx benign; Mallinpatti scale 2 Neck: No JVD, no carotid bruits; normal carotid upstroke Lungs: clear to ausculatation and percussion; no wheezing or rales Chest wall: without tenderness to palpitation Heart: PMI not displaced, RRR, s1 s2 normal, 1/6 systolic murmur, no diastolic murmur, no rubs, gallops, thrills, or heaves Abdomen: soft, nontender; no hepatosplenomehaly, BS+; abdominal aorta nontender and not dilated by palpation. Back: no CVA tenderness Pulses 2+ Musculoskeletal: full range of motion, normal strength, no joint deformities Extremities: no clubbing cyanosis or edema, Homan's sign negative  Neurologic: grossly nonfocal; Cranial nerves grossly wnl Psychologic: Normal mood and affect   Studies/Labs Reviewed:   EKG:  EKG is ordered today.  ECG (independently read by me): NSR at 65, no ectopy or ST changes and normal intervals.  Recent Labs: BMP Latest Ref Rng & Units 08/28/2020 05/27/2019  Glucose 65 - 99 mg/dL 88 133(H)  BUN 8 - 27 mg/dL 22 21  Creatinine 0.76 - 1.27 mg/dL  1.22 1.29(H)  BUN/Creat Ratio 10 - 24 18 -  Sodium 134 - 144 mmol/L 140 137  Potassium 3.5 - 5.2 mmol/L 4.8 3.9  Chloride 96 - 106 mmol/L 103 105  CO2 20 - 29 mmol/L 23 21(L)  Calcium 8.6 - 10.2 mg/dL 9.2 9.0     Hepatic Function Latest Ref Rng & Units 08/28/2020 05/27/2019  Total Protein 6.0 - 8.5 g/dL 6.2 7.4  Albumin 3.8 - 4.8 g/dL 4.1 4.5  AST 0 - 40 IU/L 19 38  ALT 0 - 44 IU/L 17 33  Alk Phosphatase 44 - 121 IU/L 62 64  Total Bilirubin 0.0 - 1.2 mg/dL 0.9 1.6(H)    CBC Latest Ref Rng & Units 08/28/2020 05/27/2019 06/03/2010  WBC 3.4 - 10.8 x10E3/uL 5.7 11.4(H) -  Hemoglobin 13.0 - 17.7 g/dL 13.6 14.6 14.2  Hematocrit 37.5 - 51.0 % 41.3 43.0 -  Platelets 150 - 450 x10E3/uL 173 207 -   Lab Results  Component Value Date   MCV 96 08/28/2020   MCV 94.5 05/27/2019  Lab Results  Component Value Date   TSH 1.980 08/28/2020   No results found for: HGBA1C   BNP No results found for: BNP  ProBNP No results found for: PROBNP   Lipid Panel     Component Value Date/Time   CHOL 166 08/28/2020 1033   TRIG 51 08/28/2020 1033   HDL 62 08/28/2020 1033   CHOLHDL 2.7 08/28/2020 1033   LDLCALC 94 08/28/2020 1033   LABVLDL 10 08/28/2020 1033     RADIOLOGY: No results found.   Additional studies/ records that were reviewed today include:   EXAM: Coronary Calcium Score  TECHNIQUE: A gated, non-contrast computed tomography scan of the heart was performed using 28m slice thickness. Axial images were analyzed on a dedicated workstation. Calcium scoring of the coronary arteries was performed using the Agatston method.  FINDINGS: Coronary arteries: Normal origins.  Coronary Calcium Score:  Left main: 0  Left anterior descending artery: 326  Left circumflex artery: 35.7  Right coronary artery: 2.14  Total: 364  Percentile: 75th  Pericardium: Normal.  Ascending Aorta: Normal caliber.  Non-cardiac: [facility].  IMPRESSION: Coronary calcium score of 364. This was 75th percentile for age-, race-, and sex-matched controls.  RECOMMENDATIONS: Coronary artery calcium (CAC) score is a strong predictor of incident coronary heart disease (CHD) and provides predictive information beyond traditional risk factors. CAC scoring is reasonable to use in the decision to withhold, postpone, or initiate statin therapy in intermediate-risk or selected borderline-risk asymptomatic adults (age 552-75years and LDL-C >=70 to <190 mg/dL) who do not have diabetes or established atherosclerotic cardiovascular disease (ASCVD).* In intermediate-risk (10-year ASCVD risk >=7.5% to <20%) adults or selected borderline-risk (10-year ASCVD risk >=5% to <7.5%) adults in whom a CAC score is measured for the purpose of making a treatment decision the following recommendations have been made:  If CAC=0, it is reasonable to withhold statin therapy and reassess in 5 to 10 years, as long as higher risk conditions are absent (diabetes mellitus, family history of premature CHD in first degree relatives (males <55 years; females <65 years), cigarette smoking, or LDL >=190 mg/dL).  If CAC is 1 to 99, it is reasonable to initiate statin therapy for patients >=523years of age.  If CAC is >=100 or >=75th percentile, it is reasonable to initiate statin therapy at any age.  Cardiology referral should be considered for patients with CAC scores >=400 or >=75th percentile.  *2018 AHA/ACC/AACVPR/AAPA/ABC/ACPM/ADA/AGS/APhA/ASPC/NLA/PCNA Guideline on the Management of Blood Cholesterol: A Report of the American College of Cardiology/American Heart Association Task Force on Clinical Practice Guidelines. J Am Coll Cardiol. 2019;73(24):3168-3209.    ASSESSMENT:    1. Elevated coronary calcium score   2. Agatston coronary artery calcium score between 200 and 399   3. Mild hyperlipidemia     PLAN:  Dr. 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  Review of his lipid studies from 2018 reveal fairly stable studies with mild elevation of LDL cholesterol most recently at 113 in 2021.  He recently had a coronary calcium score which was notable for an Agatston score of 364 placing him in the 75th percentile for age, race, and sex matched control.  The majority of his coronary calcification is in the left anterior descending artery.  His ECG today is stable.  He is completely asymptomatic despite vigorous exercise.  I had  a long discussion with him today in the office.  I reviewed data regarding subclinical atherosclerosis.  With his elevated calcium score I have recommended initiation of lipid therapy with aim to reach an LDL cholesterol less than 70 and preferably in the 50s and attempt to reduce progression, induce plaque stability, and and regression.  I discussed the different statins, and feel it will be prudent to initiate rosuvastatin at 20 mg daily.  If he does experience a myalgias I discussed the addition of supplemental coenzyme Q 10.  With his coronary calcification I also have recommended initiation of 81 mg aspirin.  Since he does exercise strenuously, I discussed possibly in the future performing a screening routine treadmill test if he plans to do high intensity workouts or as he discussed possibly try a triathlon.  At present he we will closely monitor his blood pressure and initiate high potency lipid-lowering therapy to hopefully induce plaque regression.  He sees Dr. Lang Snow for his primary care and will be following up with him.  In 3 months we will repeat lipid studies and chemistry evaluation.  Medication Adjustments/Labs and Tests Ordered: Current medicines are reviewed at length with the patient today.  Concerns regarding medicines are outlined above.  Medication changes, Labs and  Tests ordered today are listed in the Patient Instructions below. Patient Instructions  Medication Instructions:  BEGIN rosuvastatin 34m daily.  BEGIN aspirin 817monce daily.  *If you need a refill on your cardiac medications before your next appointment, please call your pharmacy*   Lab Work: None ordered.  If you have labs (blood work) drawn today and your tests are completely normal, you will receive your results only by: . Marland KitchenyChart Message (if you have MyChart) OR . A paper copy in the mail If you have any lab test that is abnormal or we need to change your treatment, we will call you to review the results.   Testing/Procedures: None ordered.    Follow-Up: [facility].  As part of our continuing mission to provide you with exceptional heart care, we have created designated Provider Care Teams.  These Care Teams include your primary Cardiologist (physician) and Advanced Practice Providers (APPs -  Physician Assistants and Nurse Practitioners) who all work together to provide you with the care you need, when you need it.  We recommend signing up for the patient portal called "MyChart".  Sign up information is provided on this After Visit Summary.  MyChart is used to connect with patients for Virtual Visits (Telemedicine).  Patients are able to view lab/test results, encounter notes, upcoming appointments, etc.  Non-urgent messages can be sent to your provider as well.   To learn more about what you can do with MyChart, go to [email or web address]   Your next appointment:   Please follow up as needed.   The format for your next appointment:   In Person  Provider:   ThShelva MajesticMD        Signed, ThShelva MajesticMD  09/10/2020 8:00 PM    [id number]  [id number]: [phone]

## 2020-09-03 NOTE — Patient Instructions (Signed)
Medication Instructions:  BEGIN rosuvastatin 20mg  daily.  BEGIN aspirin 81mg  once daily.  *If you need a refill on your cardiac medications before your next appointment, please call your pharmacy*   Lab Work: None ordered.  If you have labs (blood work) drawn today and your tests are completely normal, you will receive your results only by: Marland Kitchen MyChart Message (if you have MyChart) OR . A paper copy in the mail If you have any lab test that is abnormal or we need to change your treatment, we will call you to review the results.   Testing/Procedures: None ordered.    Follow-Up: At [facility], you and your health needs are our priority.  As part of our continuing mission to provide you with exceptional heart care, we have created designated Provider Care Teams.  These Care Teams include your primary Cardiologist (physician) and Advanced Practice Providers (APPs -  Physician Assistants and Nurse Practitioners) who all work together to provide you with the care you need, when you need it.  We recommend signing up for the patient portal called "MyChart".  Sign up information is provided on this After Visit Summary.  MyChart is used to connect with patients for Virtual Visits (Telemedicine).  Patients are able to view lab/test results, encounter notes, upcoming appointments, etc.  Non-urgent messages can be sent to your provider as well.   To learn more about what you can do with MyChart, go to [email or web address].    Your next appointment:   Please follow up as needed.   The format for your next appointment:   In Person  Provider:   Shelva Majestic, MD

## 2020-09-10 ENCOUNTER — Encounter: Payer: Self-pay | Admitting: Cardiovascular Disease

## 2020-10-28 DIAGNOSIS — F39 Unspecified mood [affective] disorder: Secondary | ICD-10-CM | POA: Diagnosis not present

## 2020-10-28 DIAGNOSIS — F5105 Insomnia due to other mental disorder: Secondary | ICD-10-CM | POA: Diagnosis not present

## 2020-11-02 DIAGNOSIS — F5105 Insomnia due to other mental disorder: Secondary | ICD-10-CM | POA: Diagnosis not present

## 2020-11-02 DIAGNOSIS — F39 Unspecified mood [affective] disorder: Secondary | ICD-10-CM | POA: Diagnosis not present

## 2020-11-02 DIAGNOSIS — F3112 Bipolar disorder, current episode manic without psychotic features, moderate: Secondary | ICD-10-CM | POA: Diagnosis not present

## 2020-11-03 DIAGNOSIS — R509 Fever, unspecified: Secondary | ICD-10-CM | POA: Diagnosis not present

## 2020-11-03 DIAGNOSIS — F3112 Bipolar disorder, current episode manic without psychotic features, moderate: Secondary | ICD-10-CM | POA: Diagnosis not present

## 2020-11-10 ENCOUNTER — Ambulatory Visit: Payer: PPO | Admitting: Behavioral Health

## 2020-11-10 DIAGNOSIS — F5105 Insomnia due to other mental disorder: Secondary | ICD-10-CM | POA: Diagnosis not present

## 2020-11-10 DIAGNOSIS — F39 Unspecified mood [affective] disorder: Secondary | ICD-10-CM | POA: Diagnosis not present

## 2020-11-10 DIAGNOSIS — F3112 Bipolar disorder, current episode manic without psychotic features, moderate: Secondary | ICD-10-CM | POA: Diagnosis not present

## 2020-11-18 DIAGNOSIS — F5105 Insomnia due to other mental disorder: Secondary | ICD-10-CM | POA: Diagnosis not present

## 2020-11-18 DIAGNOSIS — F3112 Bipolar disorder, current episode manic without psychotic features, moderate: Secondary | ICD-10-CM | POA: Diagnosis not present

## 2020-11-18 DIAGNOSIS — F39 Unspecified mood [affective] disorder: Secondary | ICD-10-CM | POA: Diagnosis not present

## 2020-11-24 ENCOUNTER — Other Ambulatory Visit: Payer: Self-pay

## 2020-11-24 ENCOUNTER — Encounter: Payer: Self-pay | Admitting: Behavioral Health

## 2020-11-24 ENCOUNTER — Ambulatory Visit: Payer: PPO | Admitting: Behavioral Health

## 2020-11-24 VITALS — BP 101/61 | HR 54 | Ht 73.0 in | Wt 198.0 lb

## 2020-11-24 DIAGNOSIS — F319 Bipolar disorder, unspecified: Secondary | ICD-10-CM

## 2020-11-24 MED ORDER — OLANZAPINE 10 MG PO TABS: 10.0000 mg | ORAL_TABLET | Freq: Every day | ORAL | 1 refills | Status: DC

## 2020-11-24 NOTE — Progress Notes (Signed)
[facility] MD/PA/NP Initial Note  11/24/2020 11:38 PM Daniel Shannon  MRN:  [id number]  Chief Complaint:  Chief Complaint   Manic Behavior; Medication Problem; Establish Care     HPI:  65 year old male presents to this office for initial visit and to establish care. His wife is present with his consent. Says that he experienced a manic episode in February 2021 that landed him in the hospital. The patient had a several day history of atypical behaviors consistent with mania, seemingly propelled by days of insomnia and recent stressors. There was no other past psychiatric history. Says he has experienced two manic episodes in his entire life. Says he was finally diagnosed by Cephus Shelling MD as having Bipolar 1 disorder after the second episode. 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 He says that he does have one sibling who also his Bipolar. Says that he has always been high energy person. He now has experience over one year without a manic episode. He says he is seeking medication management and further observation and monitoring at this practice. He would like to wean himself off both olanzapine and Depakote. He does not want to consider a bipolar maintenance medication at this time. He denies anxiety or depression at this time.   Past psychiatric medications: Seroquel Olanzapine Depakote  Visit Diagnosis:    ICD-10-CM   1. Bipolar I disorder ([place])  F31.9 OLANZapine (ZYPREXA) 10 MG tablet      Past Psychiatric History: 05/2019 First manic episode.    Past Medical History: History reviewed. No pertinent past medical history.  Past Surgical History:  Procedure Laterality Date   SHOULDER ARTHROSCOPY Bilateral 2004, 2012    Family Psychiatric History: see chart  Family History:  Family History  Problem Relation Age of Onset   Stroke Mother    Bipolar disorder Sister    Anxiety disorder Brother    Depression Brother     Diabetes Brother    Anxiety disorder Son     Social History:  Social History   Socioeconomic History   Marital status: Married    Spouse name: Mardene Celeste   Number of children: 3   Years of education: Not on file   Highest education level: Professional school degree (e.g., MD, DDS, DVM, JD)  Occupational History   Occupation: Retired Psychologist, sport and exercise  Tobacco Use   Smoking status: Never   Smokeless tobacco: Never  Vaping Use   Vaping Use: Never used  Substance and Sexual Activity   Alcohol use: Not Currently    Comment: 0   Drug use: Not Currently   Sexual activity: Yes  Other Topics Concern   Not on file  Social History Narrative   Lives with wife. Like to read, play tennis. Likes to bike long distance.    Social Determinants of Health   Financial Resource Strain: Not on file  Food Insecurity: Not on file  Transportation Needs: Not on file  Physical Activity: Not on file  Stress: Not on file  Social Connections: Not on file    Allergies: No Known Allergies  Metabolic Disorder Labs: No results found for: HGBA1C, MPG No results found for: PROLACTIN Lab Results  Component Value Date   CHOL 166 08/28/2020   TRIG 51 08/28/2020   HDL 62 08/28/2020   CHOLHDL 2.7 08/28/2020   LDLCALC 94 08/28/2020   Lab Results  Component Value Date   TSH 1.980 08/28/2020   TSH 1.103 05/29/2019  Therapeutic Level Labs: No results found for: LITHIUM Lab Results  Component Value Date   VALPROATE 15 (L) 05/27/2019   No components found for:  CBMZ  Current Medications: Current Outpatient Medications  Medication Sig Dispense Refill   OLANZapine (ZYPREXA) 10 MG tablet Take 1 tablet (10 mg total) by mouth at bedtime. 30 tablet 1   rosuvastatin (CRESTOR) 20 MG tablet Take 1 tablet (20 mg total) by mouth daily. 90 tablet 3   aspirin EC 81 MG tablet Take 1 tablet (81 mg total) by mouth daily. Swallow whole. (Patient not taking: Reported on 11/24/2020) 90 tablet 3   cholecalciferol (VITAMIN  D3) 25 MCG (1000 UNIT) tablet Take 1,000 Units by mouth daily. (Patient not taking: Reported on 11/24/2020)     divalproex (DEPAKOTE) 250 MG DR tablet Take 500 mg by mouth 2 (two) times daily.     No current facility-administered medications for this visit.    Medication Side Effects: none  Orders placed this visit:  No orders of the defined types were placed in this encounter.   Psychiatric Specialty Exam:  Review of Systems  Constitutional: Negative.   Musculoskeletal:  Negative for gait problem.  Allergic/Immunologic: Negative.   Neurological:  Negative for tremors.  Psychiatric/Behavioral: Negative.     Blood pressure 101/61, pulse (!) 54, height '6\' 1"'$  (1.854 m), weight 198 lb (89.8 kg).Body mass index is 26.12 kg/m.  General Appearance: Casual, Neat, and Well Groomed  Eye Contact:  Good  Speech:  Clear and Coherent  Volume:  Normal  Mood:  NA  Affect:  Appropriate  Thought Process:  Coherent  Orientation:  Full (Time, Place, and Person)  Thought Content: Logical   Suicidal Thoughts:  No  Homicidal Thoughts:  No  Memory:  WNL  Judgement:  Good  Insight:  Good  Psychomotor Activity:  Normal  Concentration:  Concentration: Good  Recall:  Good  Fund of Knowledge: Good  Language: Good  Assets:  Desire for Improvement Resilience  ADL's:  Intact  Cognition: WNL  Prognosis:  Good   Screenings:  Flowsheet Row ED from 05/27/2019 in [facility] DEPT  C-SSRS RISK CATEGORY No Risk       Receiving Psychotherapy: No   Treatment Plan/Recommendations:  Discussed current psychiatric medications and pt is wanting to wean off medications at this time. He is to reduce Zyprexa 10 mg to 5 mg in two weeks and remain on this dose till next appt To reduce Depakote in two weeks to 500 mg twice daily. Provided emergency contact information  Greater than 50% of 45 min face to face time with patient was spent on counseling and coordination of care. We  discussed his recent hx of mania and hospitalization. We discussed weaning off medication along with risk of relapse. Pt is currently stable but does not want to continue Depakote or olanzapine. Does not want to consider switching medications right now. We will reassess in 4 weeks to see progress.  [name], NP

## 2020-12-11 ENCOUNTER — Other Ambulatory Visit: Payer: Self-pay

## 2020-12-11 ENCOUNTER — Ambulatory Visit (INDEPENDENT_AMBULATORY_CARE_PROVIDER_SITE_OTHER): Payer: PPO | Admitting: Behavioral Health

## 2020-12-11 DIAGNOSIS — F319 Bipolar disorder, unspecified: Secondary | ICD-10-CM | POA: Diagnosis not present

## 2020-12-11 MED ORDER — DIVALPROEX SODIUM ER 250 MG PO TB24: 250.0000 mg | ORAL_TABLET | Freq: Every day | ORAL | 3 refills | Status: DC

## 2020-12-11 MED ORDER — DIVALPROEX SODIUM ER 500 MG PO TB24: 1000.0000 mg | ORAL_TABLET | Freq: Every day | ORAL | 3 refills | Status: DC

## 2020-12-13 ENCOUNTER — Encounter: Payer: Self-pay | Admitting: Behavioral Health

## 2020-12-13 NOTE — Progress Notes (Signed)
[facility]  Patient ID: Daniel Shannon,  MRN: [id number]  PCP: Ginger Organ., MD  Date of Evaluation: 12/13/2020 Time spent:60 minutes  Chief Complaint:  Chief Complaint   Manic Behavior; Medication Refill; Medication Problem; Follow-up     HISTORY/CURRENT STATUS: HPI  65 year old male presents to this office for follow up and medication management. His wife Daniel Shannon and Dr. Lynder Parents are present with his consent. He says that he has started to ween down his medications and does not expect to be on them much longer. He says that he is concerned about long term effects of Depakote contributing to early dementia which runs in his family. He says that he does not understand what his peers and family have witnessed with his behaviors that were abnormal. He says that he feels great right now and he just does not see the need to continue the Zyprexa and Depakote. He says his anxiety today is 0/10 and his depression is 0/10. He says that he believes a period of insomnia contributed to his recent mania leading to hospitalization. He currently is sleeping 6-7 hours per night and exercises frequently. His wife agrees that his behaviors have improved since taking medication. He denies mania at this time. No psychosis, or delirium. No SI or HI.  (See recommendations below)  Past psychiatric medications: Seroquel Olanzapine Depakote   Individual Medical History/ Review of Systems: Changes? :No   Allergies: Patient has no known allergies.  Current Medications:  Current Outpatient Medications:    divalproex (DEPAKOTE ER) 250 MG 24 hr tablet, Take 1 tablet (250 mg total) by mouth daily., Disp: 30 tablet, Rfl: 3   divalproex (DEPAKOTE ER) 500 MG 24 hr tablet, Take 2 tablets (1,000 mg total) by mouth daily., Disp: 60 tablet, Rfl: 3   aspirin EC 81 MG tablet, Take 1 tablet (81 mg total) by mouth daily. Swallow whole. (Patient not taking: Reported on 11/24/2020), Disp: 90 tablet, Rfl:  3   cholecalciferol (VITAMIN D3) 25 MCG (1000 UNIT) tablet, Take 1,000 Units by mouth daily. (Patient not taking: Reported on 11/24/2020), Disp: , Rfl:    divalproex (DEPAKOTE) 250 MG DR tablet, Take 500 mg by mouth 2 (two) times daily., Disp: , Rfl:    OLANZapine (ZYPREXA) 10 MG tablet, Take 1 tablet (10 mg total) by mouth at bedtime., Disp: 30 tablet, Rfl: 1   rosuvastatin (CRESTOR) 20 MG tablet, Take 1 tablet (20 mg total) by mouth daily., Disp: 90 tablet, Rfl: 3 Medication Side Effects: none  Family Medical/ Social History: Changes? No  MENTAL HEALTH EXAM:  There were no vitals taken for this visit.There is no height or weight on file to calculate BMI.  General Appearance: Casual, Neat, and Well Groomed  Eye Contact:  Good  Speech:  Clear and Coherent and Talkative  Volume:  Normal  Mood:  NA  Affect:  Appropriate  Thought Process:  Coherent  Orientation:  Full (Time, Place, and Person)  Thought Content: Logical   Suicidal Thoughts:  No  Homicidal Thoughts:  No  Memory:  WNL  Judgement:  Good  Insight:  Good  Psychomotor Activity:  Normal  Concentration:  Concentration: Good  Recall:  Good  Fund of Knowledge: Good  Language: Good  Assets:  Desire for Improvement  ADL's:  Intact  Cognition: WNL  Prognosis:  Good    DIAGNOSES:    ICD-10-CM   1. Bipolar I disorder (H[facility])  F31.9 divalproex (DEPAKOTE ER) 500 MG 24 hr tablet  divalproex (DEPAKOTE ER) 250 MG 24 hr tablet      Receiving Psychotherapy: No    RECOMMENDATIONS:   Discussed current psychiatric medications and pt is wanting to wean off medications at this time. Patient reduced his Zyprexa to 10 mg. He will reduce to 5 mg for two weeks and then to 2.5 mg for 2 weeks and then stop.   Pt agreed to continue Depakote  1250 mg ER single dose at bedtime.  Provided emergency contact information   Greater than 50% of 57 min face to face time with patient was spent on counseling and coordination of care. Arranged  this appointment today to discuss concerns with him wanting to wean and discontinue his medications for Bipolar disorder. This visit was also attended by Dr. Clovis Pu and the patients wife Daniel Shannon with his consent. After reviewing his previous medical records in depth and consulting with Dr. Nicolasa Ducking, and Dr. Clovis Pu, we were in agreement that discontinuing his medication would be unwise. We concurred that this would increase the patients risk of hospitalization and likelihood of another severe manic episode. We reviewed  his recent hx of mania and hospitalization as well as his family hx of Bipolar. We discussed various medication alternatives to Depakote and associated risk. Pt is expresses concern about not understanding what behaviors family and other peers are witnessing during his mania. Reinforced with pt that multiple MD's and other medical professionals have recommended he remain on medications. He has agreed to do so at this time and understands that this office will require compliance with medications to render appropriate care. He agreed to schedule appt in  weeks to reassess.    [name], NP

## 2020-12-22 ENCOUNTER — Ambulatory Visit: Payer: PPO | Admitting: Behavioral Health

## 2020-12-24 DIAGNOSIS — Z0000 Encounter for general adult medical examination without abnormal findings: Secondary | ICD-10-CM | POA: Diagnosis not present

## 2020-12-24 DIAGNOSIS — R7303 Prediabetes: Secondary | ICD-10-CM | POA: Diagnosis not present

## 2020-12-24 DIAGNOSIS — Z125 Encounter for screening for malignant neoplasm of prostate: Secondary | ICD-10-CM | POA: Diagnosis not present

## 2020-12-31 DIAGNOSIS — Z0000 Encounter for general adult medical examination without abnormal findings: Secondary | ICD-10-CM | POA: Diagnosis not present

## 2020-12-31 DIAGNOSIS — Z23 Encounter for immunization: Secondary | ICD-10-CM | POA: Diagnosis not present

## 2020-12-31 DIAGNOSIS — E7800 Pure hypercholesterolemia, unspecified: Secondary | ICD-10-CM | POA: Diagnosis not present

## 2020-12-31 DIAGNOSIS — F3174 Bipolar disorder, in full remission, most recent episode manic: Secondary | ICD-10-CM | POA: Diagnosis not present

## 2020-12-31 DIAGNOSIS — G319 Degenerative disease of nervous system, unspecified: Secondary | ICD-10-CM | POA: Diagnosis not present

## 2020-12-31 DIAGNOSIS — R7301 Impaired fasting glucose: Secondary | ICD-10-CM | POA: Diagnosis not present

## 2020-12-31 DIAGNOSIS — I2510 Atherosclerotic heart disease of native coronary artery without angina pectoris: Secondary | ICD-10-CM | POA: Diagnosis not present

## 2021-01-07 ENCOUNTER — Ambulatory Visit (INDEPENDENT_AMBULATORY_CARE_PROVIDER_SITE_OTHER): Payer: PPO | Admitting: Otolaryngology

## 2021-01-18 ENCOUNTER — Encounter: Payer: Self-pay | Admitting: Behavioral Health

## 2021-01-18 ENCOUNTER — Ambulatory Visit (INDEPENDENT_AMBULATORY_CARE_PROVIDER_SITE_OTHER): Payer: PPO | Admitting: Behavioral Health

## 2021-01-18 ENCOUNTER — Other Ambulatory Visit: Payer: Self-pay

## 2021-01-18 DIAGNOSIS — F319 Bipolar disorder, unspecified: Secondary | ICD-10-CM

## 2021-01-18 NOTE — Progress Notes (Signed)
[facility]  Patient ID: Daniel Shannon,  MRN: [id number]  PCP: Ginger Organ., MD  Date of Evaluation: 01/18/2021 Time spent:30 minutes  Chief Complaint:  Chief Complaint   Anxiety; Depression; Follow-up; Medication Refill     HISTORY/CURRENT STATUS: HPI 65 year old male presents to this office for follow up and medication management. He appears somewhat distressed and sad. His wife is present during interview with his consent. He says that he is disturbed about his current financial situation and says he now questions whether he planned well enough to really retire. His wife assures him that he done a great job and that he should not worry. She says that he cannot stop fixating on this. They have meeting with financial advisor tomorrow.and hope that he can reassure him that he is ok. He says that he has been feeling down for a couple weeks now. Had one visit with his father and says that he became more depressed after seeing his fathers home. Says it need repairs but his Dad still plays gold all the time and is happy. He does not want to add another medication to his regimen at this time. He does understand that the depression could be part of changes related to bipolar. He reports anxiety at 6/10 and depression at 5/10 but believes it is situational. Agrees to call if depression worsens. He reports sleeping 6-7 hours most nights. He denies any recent mania, no psychosis. Denies SI/HI.   Past psychiatric medications: Seroquel Olanzapine Depakote       Individual Medical History/ Review of Systems: Changes? :No   Allergies: Patient has no known allergies.  Current Medications:  Current Outpatient Medications:    aspirin EC 81 MG tablet, Take 1 tablet (81 mg total) by mouth daily. Swallow whole. (Patient not taking: Reported on 11/24/2020), Disp: 90 tablet, Rfl: 3   cholecalciferol (VITAMIN D3) 25 MCG (1000 UNIT) tablet, Take 1,000 Units by mouth daily. (Patient not  taking: Reported on 11/24/2020), Disp: , Rfl:    divalproex (DEPAKOTE ER) 250 MG 24 hr tablet, Take 1 tablet (250 mg total) by mouth daily., Disp: 30 tablet, Rfl: 3   divalproex (DEPAKOTE ER) 500 MG 24 hr tablet, Take 2 tablets (1,000 mg total) by mouth daily., Disp: 60 tablet, Rfl: 3   divalproex (DEPAKOTE) 250 MG DR tablet, Take 500 mg by mouth 2 (two) times daily. (Patient not taking: Reported on 12/13/2020), Disp: , Rfl:    OLANZapine (ZYPREXA) 10 MG tablet, Take 1 tablet (10 mg total) by mouth at bedtime., Disp: 30 tablet, Rfl: 1   rosuvastatin (CRESTOR) 20 MG tablet, Take 1 tablet (20 mg total) by mouth daily., Disp: 90 tablet, Rfl: 3 Medication Side Effects: none  Family Medical/ Social History: Changes? No  MENTAL HEALTH EXAM:  There were no vitals taken for this visit.There is no height or weight on file to calculate BMI.  General Appearance: Casual, Neat, and Well Groomed  Eye Contact:  Good  Speech:  Clear and Coherent  Volume:  Normal  Mood:  Anxious and Depressed  Affect:  Appropriate, Depressed, and Anxious  Thought Process:  Coherent  Orientation:  Full (Time, Place, and Person)  Thought Content: Logical   Suicidal Thoughts:  No  Homicidal Thoughts:  No  Memory:  WNL  Judgement:  Good  Insight:  Good  Psychomotor Activity:  Normal  Concentration:  Concentration: Good  Recall:  Good  Fund of Knowledge: Good  Language: Good  Assets:  Desire  for Improvement  ADL's:  Intact  Cognition: WNL  Prognosis:  Good    DIAGNOSES:    ICD-10-CM   1. Bipolar I disorder ([place])  F31.9     2. Bipolar depression ([place])  F31.9       Receiving Psychotherapy: No   Stopped Zyprexa Continue Depakote  1250 mg ER single dose at bedtime.  Provided emergency contact information  To follow up in 6 weeks to reassess or sooner if depression does not improve    Greater than 50% of 30 min face to face time with patient was spent on counseling and coordination of care. We discussed  various medication alternatives to Depakote and associated risk. We discussed his current anxiety and depression level. Pt is concerned about his finances and whether he has enough money to be comfortable. His wife who is present during interview assures him that they are fine. He has appointment with financial advisor tomorrow. He agreed to call back for sooner appointment if depression does not improve over next couple of weeks. He does not want to add medication if all possible.   Reviewed PDMP          Daniel Brooklyn, NP

## 2021-02-12 ENCOUNTER — Telehealth: Payer: Self-pay | Admitting: Behavioral Health

## 2021-02-12 NOTE — Telephone Encounter (Signed)
Pt left a message that wants to talk to brian about a prescription. Please give him a call at [phone]

## 2021-02-15 ENCOUNTER — Other Ambulatory Visit: Payer: Self-pay | Admitting: Behavioral Health

## 2021-02-15 DIAGNOSIS — F319 Bipolar disorder, unspecified: Secondary | ICD-10-CM

## 2021-02-15 MED ORDER — LURASIDONE HCL 40 MG PO TABS: 40.0000 mg | ORAL_TABLET | Freq: Every day | ORAL | 1 refills | Status: DC

## 2021-02-15 NOTE — Telephone Encounter (Signed)
Pt stated he is out of latuda samples and needs a Rx sent to [facility], [address] AT [place].He also stated he does see benefit from it but does not want to be on it long term.He is taking 40 mg daily and has appt on 11/14

## 2021-02-15 NOTE — Telephone Encounter (Signed)
Do you want to call,or should I ?

## 2021-02-15 NOTE — Telephone Encounter (Signed)
Escribed  #30 Latuda, 40 mg tablet daily. No further action needed.

## 2021-02-15 NOTE — Telephone Encounter (Signed)
Please call and get information please. Thank you.

## 2021-03-01 ENCOUNTER — Ambulatory Visit: Payer: PPO | Admitting: Behavioral Health

## 2021-03-01 ENCOUNTER — Other Ambulatory Visit: Payer: Self-pay

## 2021-03-01 ENCOUNTER — Encounter: Payer: Self-pay | Admitting: Behavioral Health

## 2021-03-01 DIAGNOSIS — F319 Bipolar disorder, unspecified: Secondary | ICD-10-CM | POA: Diagnosis not present

## 2021-03-01 MED ORDER — LURASIDONE HCL 40 MG PO TABS
40.0000 mg | ORAL_TABLET | Freq: Every day | ORAL | 1 refills | Status: DC
Start: 2021-03-01 — End: 2021-03-09

## 2021-03-01 NOTE — Progress Notes (Signed)
[facility]  Patient ID: Daniel Shannon,  MRN: [id number]  PCP: Ginger Organ., MD  Date of Evaluation: 03/01/2021 Time spent:30 minutes  Chief Complaint:  Chief Complaint   Depression; Manic Behavior; Anxiety; Follow-up; Medication Refill     HISTORY/CURRENT STATUS: HPI  65 year old male presents to this office for follow up and medication management. He appear more upbeat and is smiling. However pt reports he is not sure Latuda has helped at all. He feels his depression was situational over finances. Spouse who is present with his consent says that the family has seen significant improvement since starting [place]. She says that conversations with him are not so stressful. He still is insisting that he want to come of medication but agrees to continue for longer period. Wife pleads with him to continue during interview. He says his anxiety today is 3/10 and depression is 2/10. Agrees to call if depression worsens. He reports sleeping 6-7 hours most nights. He denies any recent mania, no psychosis. Denies SI/HI.    Past psychiatric medications: Seroquel Olanzapine Depakote     Individual Medical History/ Review of Systems: Changes? :No   Allergies: Patient has no known allergies.  Current Medications:  Current Outpatient Medications:    aspirin EC 81 MG tablet, Take 1 tablet (81 mg total) by mouth daily. Swallow whole. (Patient not taking: Reported on 11/24/2020), Disp: 90 tablet, Rfl: 3   cholecalciferol (VITAMIN D3) 25 MCG (1000 UNIT) tablet, Take 1,000 Units by mouth daily. (Patient not taking: Reported on 11/24/2020), Disp: , Rfl:    divalproex (DEPAKOTE ER) 250 MG 24 hr tablet, Take 1 tablet (250 mg total) by mouth daily., Disp: 30 tablet, Rfl: 3   divalproex (DEPAKOTE ER) 500 MG 24 hr tablet, Take 2 tablets (1,000 mg total) by mouth daily., Disp: 60 tablet, Rfl: 3   divalproex (DEPAKOTE) 250 MG DR tablet, Take 500 mg by mouth 2 (two) times daily. (Patient not  taking: Reported on 12/13/2020), Disp: , Rfl:    lurasidone (LATUDA) 40 MG TABS tablet, Take 1 tablet (40 mg total) by mouth daily with breakfast., Disp: 30 tablet, Rfl: 1   rosuvastatin (CRESTOR) 20 MG tablet, Take 1 tablet (20 mg total) by mouth daily., Disp: 90 tablet, Rfl: 3 Medication Side Effects: none  Family Medical/ Social History: Changes? No  MENTAL HEALTH EXAM:  There were no vitals taken for this visit.There is no height or weight on file to calculate BMI.  General Appearance: Casual and Neat  Eye Contact:  Good  Speech:  Clear and Coherent  Volume:  Normal  Mood:  NA  Affect:  Appropriate  Thought Process:  Coherent  Orientation:  Full (Time, Place, and Person)  Thought Content: Logical   Suicidal Thoughts:  No  Homicidal Thoughts:  No  Memory:  WNL  Judgement:  Good  Insight:  Good  Psychomotor Activity:  Normal  Concentration:  Concentration: Good  Recall:  Good  Fund of Knowledge: Good  Language: Good  Assets:  Desire for Improvement  ADL's:  Intact  Cognition: WNL  Prognosis:  Good    DIAGNOSES:    ICD-10-CM   1. Bipolar I disorder (HCC)  F31.9 lurasidone (LATUDA) 40 MG TABS tablet    2. Bipolar depression ([place])  F31.9 lurasidone (LATUDA) 40 MG TABS tablet      Receiving Psychotherapy: No    RECOMMENDATIONS:   Continue Latuda 40 mg daily Continue Depakote  1250 mg ER single dose at bedtime.  Provided emergency contact information  Will follow up in 4 months per patients request. He did not want to come in sooner.  Provided emergency contact information    Greater than 50% of 30 min face to face time with patient was spent on counseling and coordination of care. Patient is still hyper fixated on finances contrary to what spouse says. She say money in surely no problem. He sometimes is difficult to redirect because his only concern he wants to address is worry over the cost of his home addition or the cost of  is medication. His depression has  markedly improved since last visit though. He appears less tense and worried.  Wife says he is 90% better and family notices.  He refuses to increases Latuda to 60 mg daily as recommended and insist he wants to com off soon. I did convince him to continue his meds for now and reinforced the chances for relapse if he stops meds.    Reviewed PDMP       [name], NP

## 2021-03-09 ENCOUNTER — Other Ambulatory Visit: Payer: Self-pay | Admitting: Behavioral Health

## 2021-03-09 DIAGNOSIS — F319 Bipolar disorder, unspecified: Secondary | ICD-10-CM

## 2021-03-09 MED ORDER — LURASIDONE HCL 40 MG PO TABS: 40.0000 mg | ORAL_TABLET | Freq: Every day | ORAL | 0 refills | Status: DC

## 2021-03-09 NOTE — Telephone Encounter (Unsigned)
Pt is requesting RF of Latuda -he would like to get a 90 day RF please sent to [facility]. He is also anxious about getting the two doses of his Depakote filled. This RF request just came in today.

## 2021-03-18 ENCOUNTER — Other Ambulatory Visit: Payer: Self-pay | Admitting: Surgery

## 2021-03-18 DIAGNOSIS — C44519 Basal cell carcinoma of skin of other part of trunk: Secondary | ICD-10-CM | POA: Diagnosis not present

## 2021-04-26 ENCOUNTER — Telehealth: Payer: Self-pay | Admitting: Behavioral Health

## 2021-04-26 NOTE — Telephone Encounter (Signed)
Notified patient of samples.

## 2021-04-26 NOTE — Telephone Encounter (Signed)
Patient called with regards to [place] cost. His insurance has changed Latuda from a tier 3 to a tier 5 and the cost with insurance is going to be $500/mo. This is name brand. They were told at the pharmacy there is generic. GoodRx cost is $1400+/mo. Patient sees you in March. He is doing well currently. He said that you thought if he was doing well at that time he could stop the [place]. He has no urgent need to be seen earlier, but asks if that is an option as well, or a change to a different medication.

## 2021-04-26 NOTE — Telephone Encounter (Signed)
Please advise Pt that  he can come by and get samples. They will be 80 mg tablets so he may split in half for 40 mg daily. Tell him we are working on his case for pre authorization and will advise as soon as we know.  I will leave the samples up front at the desk for him.

## 2021-04-26 NOTE — Telephone Encounter (Signed)
Please advise the pt that I will be reaching out to them tomorrow with a plan to

## 2021-04-26 NOTE — Telephone Encounter (Signed)
Patient lvm he would like to speak with BW regarding his Latuda prescription. States that he has some questions and needs clarification on some things. Pls rtc to discuss [phone]

## 2021-06-14 ENCOUNTER — Other Ambulatory Visit: Payer: Self-pay | Admitting: Behavioral Health

## 2021-06-14 DIAGNOSIS — F319 Bipolar disorder, unspecified: Secondary | ICD-10-CM

## 2021-06-28 IMAGING — MR MR HEAD WO/W CM
16 series · 48 of 48 positions shown · IV contrast (gadavist)
Comparison: None.

CLINICAL DATA: Rule out multiple sclerosis

EXAM:
MRI HEAD WITHOUT AND WITH CONTRAST
TECHNIQUE: Multiplanar, multiecho pulse sequences of the brain and surrounding
structures were obtained without and with intravenous contrast.
CONTRAST:  9mL GADAVIST GADOBUTROL 1 MMOL/ML IV SOLN

[Series 5: T1 · sagittal · 5.0mm · 0.78mm/px · 2 of 24 slices shown (1 of 2)]
[im 1/24]
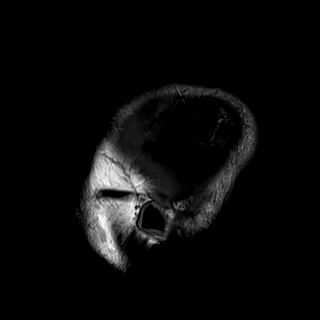
[im 24/24]
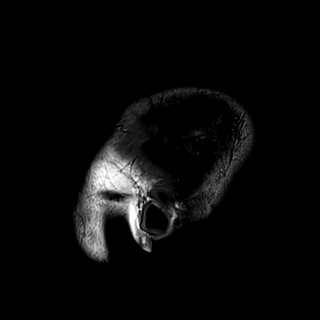

[Series 6: FLAIR · sagittal · 1.1mm · 0.52mm/px · 7 of 128 slices shown (1 of 2)]
[im 1/128]
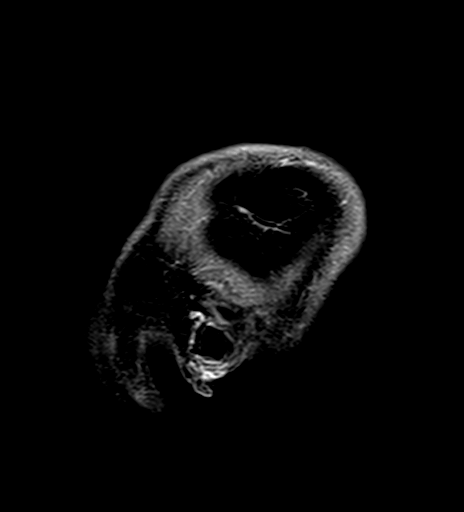
[im 22/128]
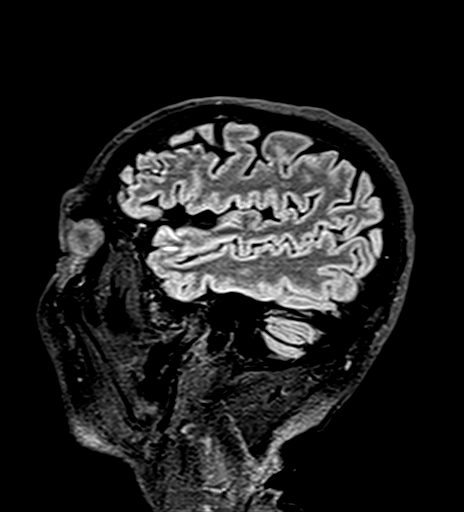
[im 43/128]
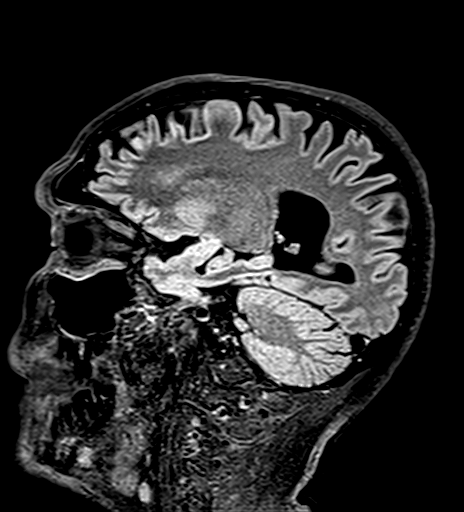
[im 64/128]
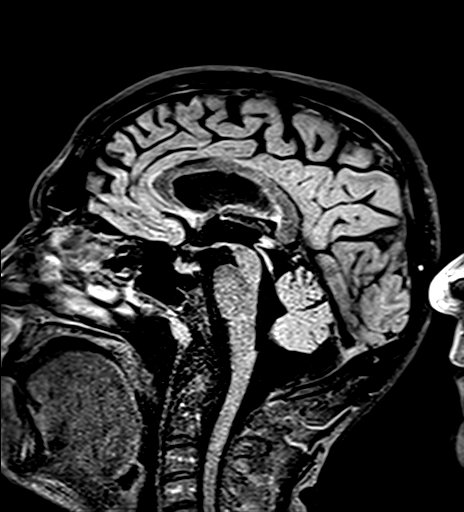
[im 85/128]
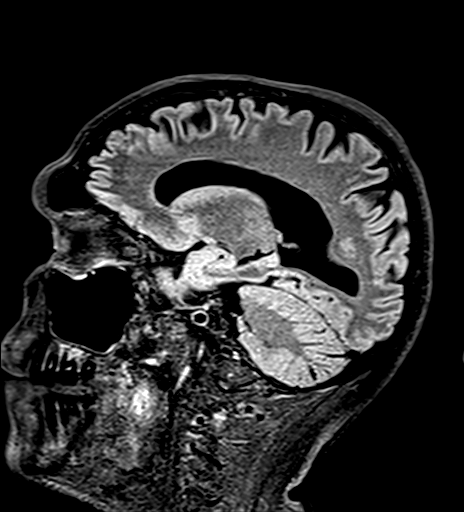
[im 106/128]
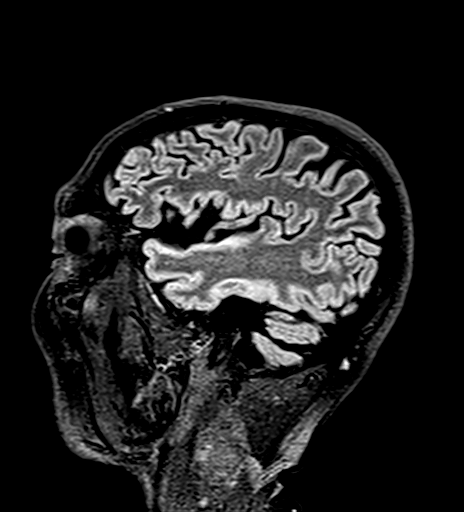
[im 128/128]
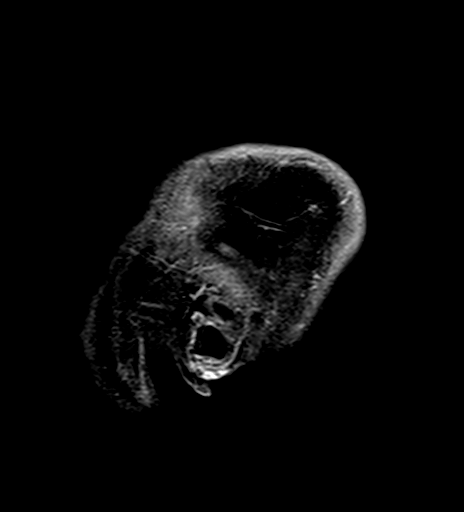

[Series 7: T2 · sagittal · 1.5mm · 1.46mm/px · 5 of 96 slices shown (1 of 2)]
[im 1/96]
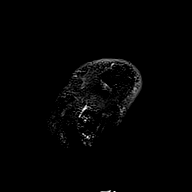
[im 24/96]
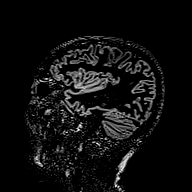
[im 48/96]
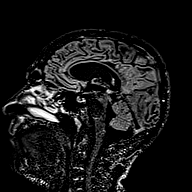
[im 72/96]
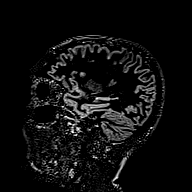
[im 96/96]
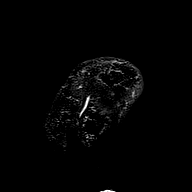

[Series 8: DWI · axial · 5.0mm · 1.36mm/px · z∈[-34,+115]mm · 3 of 64 slices shown (1 of 4)]
[im 1/64]
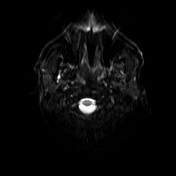
[im 32/64]
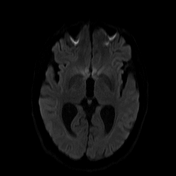
[im 64/64]
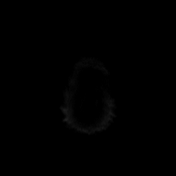

[Series 9: DWI · axial · 5.0mm · 1.36mm/px · z∈[-34,+115]mm · 2 of 32 slices shown (2 of 4)]
[im 1/32]
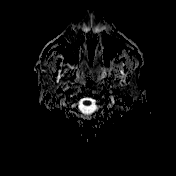
[im 32/32]
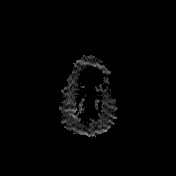

[Series 10: T2 · axial · 5.0mm · 0.75mm/px · 1 of 25 slices shown (2 of 2)]
[im 1/25]
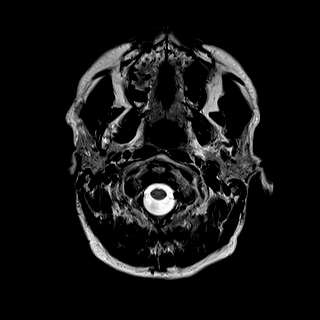

[Series 11: mip_images(sw) · axial · 24.0mm · 0.75mm/px · z∈[-23,+105]mm · 2 of 45 slices shown]
[im 1/45]
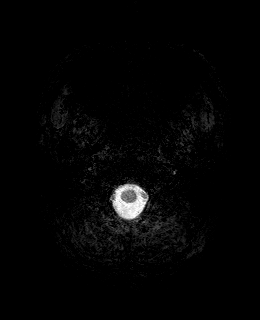
[im 45/45]
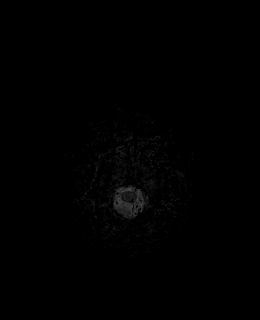

[Series 12: swi_images · axial · 3.0mm · 0.75mm/px · z∈[-33,+115]mm · 3 of 52 slices shown]
[im 1/52]
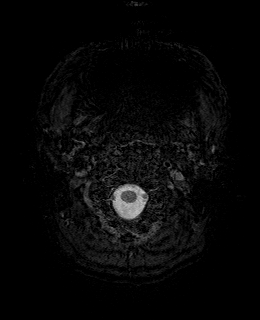
[im 26/52]
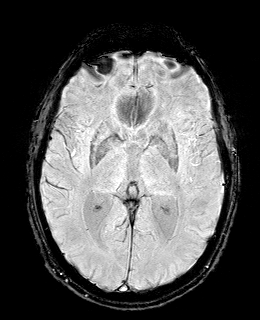
[im 52/52]
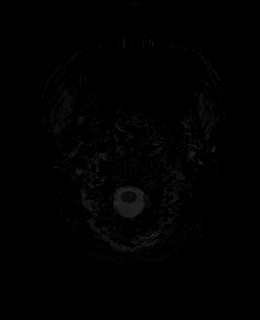

[Series 13: FLAIR · axial · 3.0mm · 0.86mm/px · z∈[-30,+107]mm · 3 of 48 slices shown (2 of 2)]
[im 1/48]
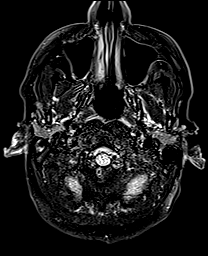
[im 24/48]
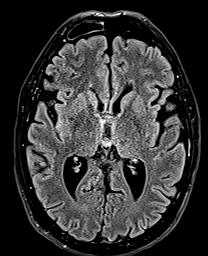
[im 48/48]
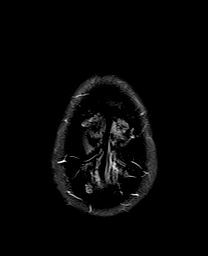

[Series 14: DWI · coronal · 5.0mm · 1.31mm/px · 4 of 80 slices shown (3 of 4)]
[im 1/80]
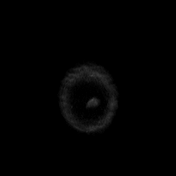
[im 27/80]
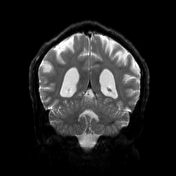
[im 53/80]
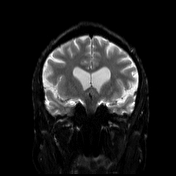
[im 80/80]
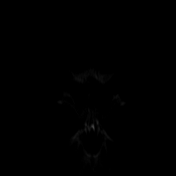

[Series 15: DWI · coronal · 5.0mm · 1.31mm/px · 2 of 40 slices shown (4 of 4)]
[im 1/40]
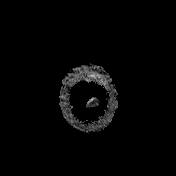
[im 40/40]
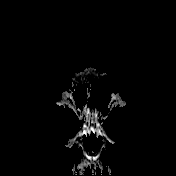

[Series 16: T1 · axial · 5.0mm · 0.45mm/px · 1 of 25 slices shown (2 of 2)]
[im 1/25]
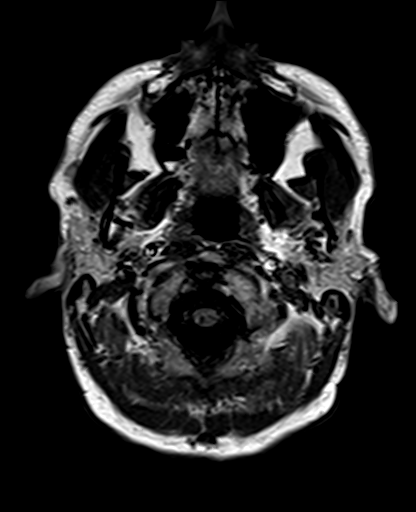

[Series 17: T1 post-contrast · axial · 1.0mm · 0.94mm/px · z∈[-35,+118]mm · 8 of 160 slices shown (1 of 3)]
[im 1/160]
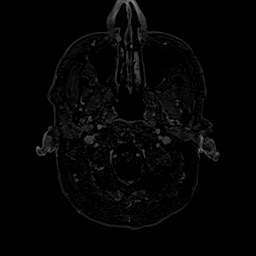
[im 23/160]
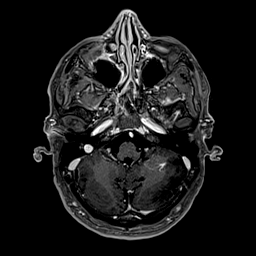
[im 46/160]
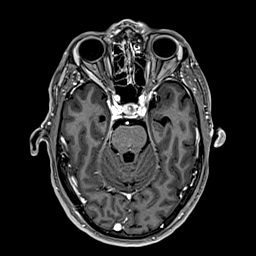
[im 69/160]
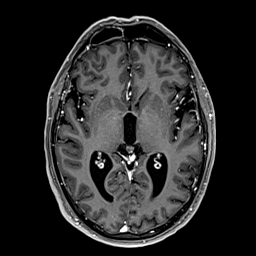
[im 91/160]
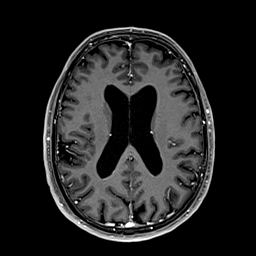
[im 114/160]
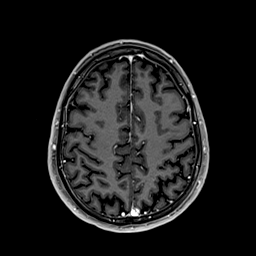
[im 137/160]
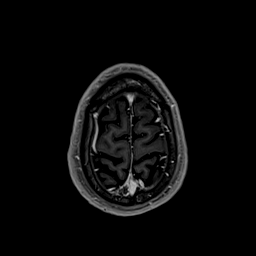
[im 160/160]
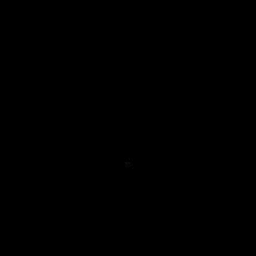

[Series 18: T2 post-contrast · coronal · 5.0mm · 0.86mm/px · 2 of 32 slices shown]
[im 1/32]
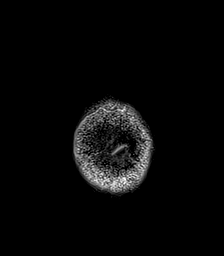
[im 32/32]
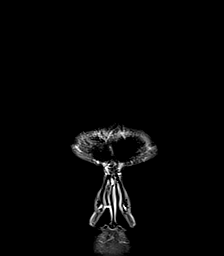

[Series 19: T1 post-contrast · sagittal · 5.0mm · 0.94mm/px · 1 of 24 slices shown (2 of 3)]
[im 1/24]
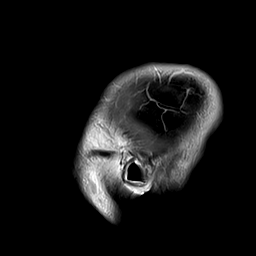

[Series 20: T1 post-contrast · coronal · 5.0mm · 0.43mm/px · 2 of 32 slices shown (3 of 3)]
[im 1/32]
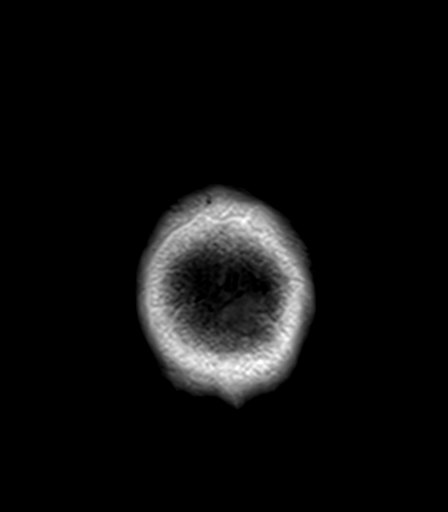
[im 32/32]
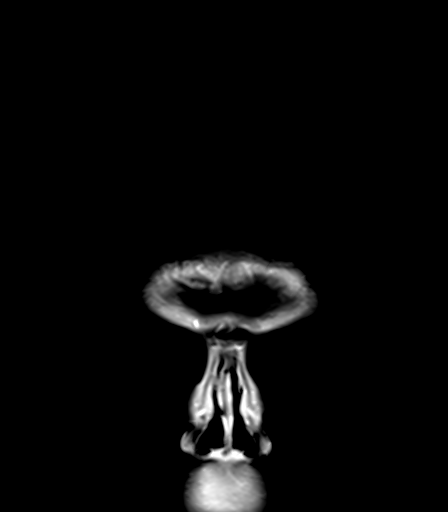

[48 of 48 positions shown; findings below may reference images not displayed]

FINDINGS: Brain: No acute infarction, hemorrhage, extra-axial collection or
mass lesion. No white matter lesion identified. There is prominence
of the ventricular system and less evident of the cerebral sulci,
suggesting parenchymal volume loss.

Vascular: Incidentally noted developmental venous anomaly in the
left cerebellar hemisphere. Flow voids are maintained

Skull and upper cervical spine: Normal marrow signal.

Sinuses/Orbits: Mild mucosal thickening of the ethmoid cells. The
orbits are grossly unremarkable.

Other: None.
IMPRESSION: 1. No white matter lesion identified to suggest demyelinating
disease.
2. Moderate parenchymal volume loss.

## 2021-06-29 ENCOUNTER — Encounter: Payer: Self-pay | Admitting: Behavioral Health

## 2021-06-29 ENCOUNTER — Other Ambulatory Visit: Payer: Self-pay

## 2021-06-29 ENCOUNTER — Ambulatory Visit: Payer: PPO | Admitting: Behavioral Health

## 2021-06-29 DIAGNOSIS — Z79899 Other long term (current) drug therapy: Secondary | ICD-10-CM | POA: Diagnosis not present

## 2021-06-29 DIAGNOSIS — F319 Bipolar disorder, unspecified: Secondary | ICD-10-CM | POA: Diagnosis not present

## 2021-06-29 NOTE — Progress Notes (Signed)
[facility] ? ?Patient ID: Daniel Shannon,  ?MRN: [id number] ? ?PCP: Ginger Organ., MD ? ?Date of Evaluation: 06/29/2021 ?Time spent:20 minutes ? ?Chief Complaint:  ?Chief Complaint   ?Depression; Anxiety; Follow-up; Medication Refill; Medication Problem ?  ? ? ?HISTORY/CURRENT STATUS: ?HPI ? ?66 year old male presents to this office for follow up and medication management. He appear more upbeat and is smiling. He wants to stop the [place] and says that he is in a better place now. His wife agrees. He is insistent on returning to monotherapy with Depakote. He says his anxiety today is 2/10 and depression is 1/10. Agrees to call if depression worsens. He reports sleeping 6-7 hours most nights. He denies any recent mania, no psychosis. Denies SI/HI. He request 4 month follow up. He agrees to lab draw for Depakote.  ?  ?Past psychiatric medications: ?Seroquel ?Olanzapine ?Depakote ? ? ? ?Individual Medical History/ Review of Systems: Changes? :No  ? ?Allergies: Patient has no known allergies. ? ?Current Medications:  ?Current Outpatient Medications:  ?  divalproex (DEPAKOTE ER) 250 MG 24 hr tablet, TAKE 1 TABLET(250 MG) BY MOUTH DAILY, Disp: 90 tablet, Rfl: 0 ?  aspirin EC 81 MG tablet, Take 1 tablet (81 mg total) by mouth daily. Swallow whole. (Patient not taking: Reported on 11/24/2020), Disp: 90 tablet, Rfl: 3 ?  cholecalciferol (VITAMIN D3) 25 MCG (1000 UNIT) tablet, Take 1,000 Units by mouth daily. (Patient not taking: Reported on 11/24/2020), Disp: , Rfl:  ?  divalproex (DEPAKOTE ER) 500 MG 24 hr tablet, TAKE 2 TABLETS(1000 MG) BY MOUTH DAILY, Disp: 180 tablet, Rfl: 0 ?  divalproex (DEPAKOTE) 250 MG DR tablet, Take 500 mg by mouth 2 (two) times daily. (Patient not taking: Reported on 12/13/2020), Disp: , Rfl:  ?  lurasidone (LATUDA) 40 MG TABS tablet, Take 1 tablet (40 mg total) by mouth daily with breakfast., Disp: 90 tablet, Rfl: 0 ?  rosuvastatin (CRESTOR) 20 MG tablet, Take 1 tablet (20 mg total) by  mouth daily., Disp: 90 tablet, Rfl: 3 ?Medication Side Effects: none ? ?Family Medical/ Social History: Changes? No ? ?MENTAL HEALTH EXAM: ? ?There were no vitals taken for this visit.There is no height or weight on file to calculate BMI.  ?General Appearance: Casual and Neat  ?Eye Contact:  Good  ?Speech:  Clear and Coherent  ?Volume:  Normal  ?Mood:  NA  ?Affect:  Appropriate  ?Thought Process:  Coherent  ?Orientation:  Full (Time, Place, and Person)  ?Thought Content: Logical   ?Suicidal Thoughts:  No  ?Homicidal Thoughts:  No  ?Memory:  WNL  ?Judgement:  Good  ?Insight:  Good  ?Psychomotor Activity:  Normal  ?Concentration:  Concentration: Good  ?Recall:  Good  ?Fund of Knowledge: Good  ?Language: Good  ?Assets:  Desire for Improvement  ?ADL's:  Intact  ?Cognition: WNL  ?Prognosis:  Good  ? ? ?DIAGNOSES:  ?  ICD-10-CM   ?1. Bipolar I disorder ([place])  F31.9   ?  ?2. High risk medication use  Z79.899 Valproic acid level  ?  ? ? ?Receiving Psychotherapy: No  ? ? ?RECOMMENDATIONS:  ? ?Reduce Latuda to by 50 % for one week, then another 50% and then stop. ?Continue Depakote  1250 mg ER single dose at bedtime.  ?Provided emergency contact information  ?Will follow up in 4 months per patients request. He did not want to come in sooner.  ?Provided emergency contact information  ?  ?Greater than 50% of 30  min face to face time with patient was spent on counseling and coordination of care. Patient is determined to stop his [place]. He says that he is in a good place now and wife is in agreement. Provided instructions on weaning down. He was advised that we need a current Depakote lab draw this visit to determine levels.  His depression has markedly improved since last visit though. He appears happy and would like to return to monotherapy of Depakote.  ?  ?Reviewed PDMP ? ? ?[name], NP  ?

## 2021-06-30 ENCOUNTER — Other Ambulatory Visit: Payer: Self-pay | Admitting: Behavioral Health

## 2021-06-30 DIAGNOSIS — Z79899 Other long term (current) drug therapy: Secondary | ICD-10-CM | POA: Diagnosis not present

## 2021-07-01 LAB — VALPROIC ACID LEVEL: Valproic Acid Lvl: 65 ug/mL (ref 50–100)

## 2021-09-01 ENCOUNTER — Other Ambulatory Visit: Payer: Self-pay | Admitting: Cardiovascular Disease

## 2021-09-03 ENCOUNTER — Other Ambulatory Visit: Payer: Self-pay | Admitting: Surgery

## 2021-09-03 DIAGNOSIS — C44619 Basal cell carcinoma of skin of left upper limb, including shoulder: Secondary | ICD-10-CM | POA: Diagnosis not present

## 2021-09-11 ENCOUNTER — Other Ambulatory Visit: Payer: Self-pay | Admitting: Behavioral Health

## 2021-09-11 DIAGNOSIS — F319 Bipolar disorder, unspecified: Secondary | ICD-10-CM

## 2021-10-26 ENCOUNTER — Ambulatory Visit: Payer: PPO | Admitting: Behavioral Health

## 2021-11-17 ENCOUNTER — Ambulatory Visit: Payer: PPO | Admitting: Behavioral Health

## 2021-11-17 ENCOUNTER — Encounter: Payer: Self-pay | Admitting: Behavioral Health

## 2021-11-17 DIAGNOSIS — F319 Bipolar disorder, unspecified: Secondary | ICD-10-CM

## 2021-11-17 DIAGNOSIS — Z79899 Other long term (current) drug therapy: Secondary | ICD-10-CM

## 2021-11-17 MED ORDER — DIVALPROEX SODIUM ER 250 MG PO TB24: ORAL_TABLET | ORAL | 2 refills | Status: DC

## 2021-11-17 MED ORDER — DIVALPROEX SODIUM ER 500 MG PO TB24: ORAL_TABLET | ORAL | 2 refills | Status: DC

## 2021-11-17 NOTE — Progress Notes (Signed)
[facility]  Patient ID: Daniel Shannon,  MRN: [id number]  PCP: Ginger Organ., MD  Date of Evaluation: 11/17/2021 Time spent:30 minutes  Chief Complaint:  Chief Complaint   Depression; Follow-up; Medication Refill; Patient Education     HISTORY/CURRENT STATUS: HPI  66 year old male presents to this office for follow up and medication management. He appear more upbeat and is smiling. He is very alert and focused today. Speech is normal. Wife says that, "he has been more like the old Ser[place] since last visit". Although hesitant, he agrees to continue to monotherapy of Depakote. He says his anxiety today is 1/10 and depression is 1/10. He reports sleeping 6-7 hours most nights. He denies any recent mania, no psychosis. Denies SI/HI. He request 6 month follow up. He agrees to lab draw for Depakote.    Past psychiatric medications: Seroquel Olanzapine Depakote     Individual Medical History/ Review of Systems: Changes? :No   Allergies: Patient has no known allergies.  Current Medications:  Current Outpatient Medications:    aspirin EC 81 MG tablet, Take 1 tablet (81 mg total) by mouth daily. Swallow whole. (Patient not taking: Reported on 11/24/2020), Disp: 90 tablet, Rfl: 3   cholecalciferol (VITAMIN D3) 25 MCG (1000 UNIT) tablet, Take 1,000 Units by mouth daily. (Patient not taking: Reported on 11/24/2020), Disp: , Rfl:    divalproex (DEPAKOTE ER) 250 MG 24 hr tablet, TAKE 1 TABLET(250 MG) BY MOUTH DAILY, Disp: 90 tablet, Rfl: 2   divalproex (DEPAKOTE ER) 500 MG 24 hr tablet, TAKE 2 TABLETS(1000 MG) BY MOUTH DAILY, Disp: 180 tablet, Rfl: 2   divalproex (DEPAKOTE) 250 MG DR tablet, Take 500 mg by mouth 2 (two) times daily. (Patient not taking: Reported on 12/13/2020), Disp: , Rfl:    lurasidone (LATUDA) 40 MG TABS tablet, Take 1 tablet (40 mg total) by mouth daily with breakfast., Disp: 90 tablet, Rfl: 0   rosuvastatin (CRESTOR) 20 MG tablet, TAKE 1 TABLET(20 MG) BY MOUTH  DAILY, Disp: 90 tablet, Rfl: 3 Medication Side Effects: none  Family Medical/ Social History: Changes? No  MENTAL HEALTH EXAM:  There were no vitals taken for this visit.There is no height or weight on file to calculate BMI.  General Appearance: Casual, Neat, and Well Groomed  Eye Contact:  Good  Speech:  Clear and Coherent  Volume:  Normal  Mood:  NA  Affect:  Appropriate  Thought Process:  Coherent  Orientation:  Full (Time, Place, and Person)  Thought Content: Logical   Suicidal Thoughts:  No  Homicidal Thoughts:  No  Memory:  WNL  Judgement:  Good  Insight:  Good  Psychomotor Activity:  Normal  Concentration:  Concentration: Good  Recall:  Good  Fund of Knowledge: Good  Language: Good  Assets:  Desire for Improvement  ADL's:  Intact  Cognition: WNL  Prognosis:  Good    DIAGNOSES:    ICD-10-CM   1. Bipolar I disorder ([facility])  F31.9 divalproex (DEPAKOTE ER) 250 MG 24 hr tablet    divalproex (DEPAKOTE ER) 500 MG 24 hr tablet    2. High risk medication use  Z79.899 Valproic acid level    3. Bipolar depression ([place])  F31.9       Receiving Psychotherapy: No    RECOMMENDATIONS:    Continue Depakote  1250 mg ER single dose at bedtime.  Provided emergency contact information  Will follow up in 6 months per patients request. He did not want to come in  sooner.  Provided emergency contact information    Greater than 50% of 30 min face to face time with patient was spent on counseling and coordination of care. He was advised that we need a current Depakote lab draw this visit to determine levels.  His depression has markedly improved since last visit though. Wife says that he has returned to baseline since last visit and the rest of the family has noticed. He agrees to continue with Depakote for now.    Reviewed PDMP    [name], NP

## 2021-11-25 DIAGNOSIS — Z79899 Other long term (current) drug therapy: Secondary | ICD-10-CM | POA: Diagnosis not present

## 2021-11-27 LAB — VALPROIC ACID LEVEL: Valproic Acid Lvl: 69 ug/mL (ref 50–100)

## 2021-12-08 ENCOUNTER — Other Ambulatory Visit: Payer: Self-pay | Admitting: Behavioral Health

## 2021-12-08 DIAGNOSIS — F319 Bipolar disorder, unspecified: Secondary | ICD-10-CM

## 2022-01-03 DIAGNOSIS — E7800 Pure hypercholesterolemia, unspecified: Secondary | ICD-10-CM | POA: Diagnosis not present

## 2022-01-03 DIAGNOSIS — R7303 Prediabetes: Secondary | ICD-10-CM | POA: Diagnosis not present

## 2022-01-03 DIAGNOSIS — R7301 Impaired fasting glucose: Secondary | ICD-10-CM | POA: Diagnosis not present

## 2022-01-03 DIAGNOSIS — R7989 Other specified abnormal findings of blood chemistry: Secondary | ICD-10-CM | POA: Diagnosis not present

## 2022-01-03 DIAGNOSIS — Z125 Encounter for screening for malignant neoplasm of prostate: Secondary | ICD-10-CM | POA: Diagnosis not present

## 2022-01-10 DIAGNOSIS — R7301 Impaired fasting glucose: Secondary | ICD-10-CM | POA: Diagnosis not present

## 2022-01-10 DIAGNOSIS — R82998 Other abnormal findings in urine: Secondary | ICD-10-CM | POA: Diagnosis not present

## 2022-01-10 DIAGNOSIS — G319 Degenerative disease of nervous system, unspecified: Secondary | ICD-10-CM | POA: Diagnosis not present

## 2022-01-10 DIAGNOSIS — F3174 Bipolar disorder, in full remission, most recent episode manic: Secondary | ICD-10-CM | POA: Diagnosis not present

## 2022-01-10 DIAGNOSIS — I2510 Atherosclerotic heart disease of native coronary artery without angina pectoris: Secondary | ICD-10-CM | POA: Diagnosis not present

## 2022-01-10 DIAGNOSIS — E7800 Pure hypercholesterolemia, unspecified: Secondary | ICD-10-CM | POA: Diagnosis not present

## 2022-01-10 DIAGNOSIS — Z0000 Encounter for general adult medical examination without abnormal findings: Secondary | ICD-10-CM | POA: Diagnosis not present

## 2022-05-23 ENCOUNTER — Ambulatory Visit: Payer: PPO | Admitting: Behavioral Health

## 2022-06-03 ENCOUNTER — Other Ambulatory Visit: Payer: Self-pay | Admitting: Behavioral Health

## 2022-06-03 DIAGNOSIS — F319 Bipolar disorder, unspecified: Secondary | ICD-10-CM

## 2022-06-05 NOTE — Telephone Encounter (Signed)
From last note:  Continue Depakote  1250 mg ER single dose at bedtime.   Rx RF is 250 mg. Just confirming dose.

## 2022-06-28 ENCOUNTER — Encounter: Payer: Self-pay | Admitting: Behavioral Health

## 2022-06-28 ENCOUNTER — Telehealth: Payer: Self-pay | Admitting: Behavioral Health

## 2022-06-28 ENCOUNTER — Ambulatory Visit: Payer: PPO | Admitting: Behavioral Health

## 2022-06-28 DIAGNOSIS — F319 Bipolar disorder, unspecified: Secondary | ICD-10-CM | POA: Diagnosis not present

## 2022-06-28 NOTE — Progress Notes (Signed)
[facility]  Patient ID: Daniel Shannon,  MRN: [id number]  PCP: Ginger Organ., MD  Date of Evaluation: 06/28/2022 Time spent:20 minutes  Chief Complaint:   HISTORY/CURRENT STATUS: HPI  67 year old male presents to this office for follow up and medication management. He appear more upbeat and is smiling. He is very alert and focused today. Speech is normal. Wife is present with his verbal consent. Last visit 7 months ago. She says he has been like his old self. Would like to consider slowly weaning down on Depakote. He says his anxiety today is 1/10 and depression is 1/10. He reports sleeping 6-7 hours most nights. He denies any recent mania, no psychosis. Denies SI/HI. He request 6 month follow up.    Past psychiatric medications: Seroquel Olanzapine Depakote  Individual Medical History/ Review of Systems: Changes? :No   Allergies: Patient has no known allergies.  Current Medications:  Current Outpatient Medications:    aspirin EC 81 MG tablet, Take 1 tablet (81 mg total) by mouth daily. Swallow whole. (Patient not taking: Reported on 11/24/2020), Disp: 90 tablet, Rfl: 3   cholecalciferol (VITAMIN D3) 25 MCG (1000 UNIT) tablet, Take 1,000 Units by mouth daily. (Patient not taking: Reported on 11/24/2020), Disp: , Rfl:    divalproex (DEPAKOTE ER) 250 MG 24 hr tablet, TAKE 1 TABLET(250 MG) BY MOUTH DAILY, Disp: 90 tablet, Rfl: 2   divalproex (DEPAKOTE ER) 500 MG 24 hr tablet, TAKE 2 TABLETS(1000 MG) BY MOUTH DAILY, Disp: 180 tablet, Rfl: 2   divalproex (DEPAKOTE) 250 MG DR tablet, Take 500 mg by mouth 2 (two) times daily. (Patient not taking: Reported on 12/13/2020), Disp: , Rfl:    lurasidone (LATUDA) 40 MG TABS tablet, Take 1 tablet (40 mg total) by mouth daily with breakfast., Disp: 90 tablet, Rfl: 0   rosuvastatin (CRESTOR) 20 MG tablet, TAKE 1 TABLET(20 MG) BY MOUTH DAILY, Disp: 90 tablet, Rfl: 3 Medication Side Effects: none  Family Medical/ Social History:  Changes? No  MENTAL HEALTH EXAM:  There were no vitals taken for this visit.There is no height or weight on file to calculate BMI.  General Appearance: Casual, Neat, and Well Groomed  Eye Contact:  Good  Speech:  Clear and Coherent  Volume:  Normal  Mood:  Anxious, Depressed, and Dysphoric  Affect:  Appropriate  Thought Process:  Coherent  Orientation:  Full (Time, Place, and Person)  Thought Content: Logical   Suicidal Thoughts:  No  Homicidal Thoughts:  No  Memory:  WNL  Judgement:  Good  Insight:  Good  Psychomotor Activity:  Normal  Concentration:  Concentration: Good  Recall:  Good  Fund of Knowledge: Good  Language: Good  Assets:  Desire for Improvement  ADL's:  Intact  Cognition: WNL  Prognosis:  Good    DIAGNOSES: No diagnosis found.  Receiving Psychotherapy: No    RECOMMENDATIONS:   Reduce Depakote to 1000 mg ER single dose at bedtime.  Provided emergency contact information  Will follow up in 6 months per patients request. He did not want to come in sooner.  To report worsening symptoms promptly Provided emergency contact information  Reviewed PDMP   Greater than 50% of 30 min face to face time with patient was spent on counseling and coordination of care. He was advised that we need a current Depakote lab draw this visit to determine levels.  His depression has markedly improved since last visit though. Wife says that he has returned to baseline since  last visit and the rest of the family has noticed. He agrees to continue with Depakote for now.      [name], NP

## 2022-08-25 IMAGING — CT CT CARDIAC CORONARY ARTERY CALCIUM SCORE
3 series · 14 of 20 positions shown, 16 images · non-contrast
Comparison: None
COMPARISON: None

Addendum:
EXAM:
OVER-READ INTERPRETATION  CT CHEST

The following report is an over-read performed by radiologist Dr.
Lorenz Jumper [REDACTED] on 07/31/2020. This over-read
does not include interpretation of cardiac or coronary anatomy or
pathology. The coronary calcium score interpretation by the
cardiologist is attached.
CLINICAL DATA: Cardiovascular Disease Risk stratification
Coronary Calcium Score
TECHNIQUE: A gated, non-contrast computed tomography scan of the heart was
performed using 3mm slice thickness. Axial images were analyzed on a
dedicated workstation. Calcium scoring of the coronary arteries was
performed using the Agatston method.

[Series 3: casc 3.0 best diast 71 % (id) · axial · 0.39mm/px · z∈[+1162,+1252]mm · 4 of 51 slices shown]
[im 11/51  vessel]
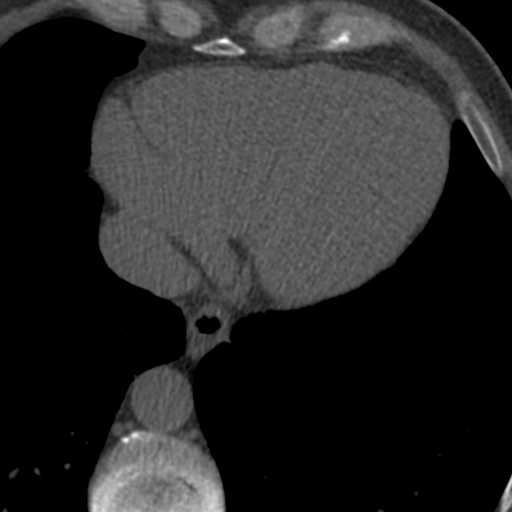
[im 21/51  vessel]
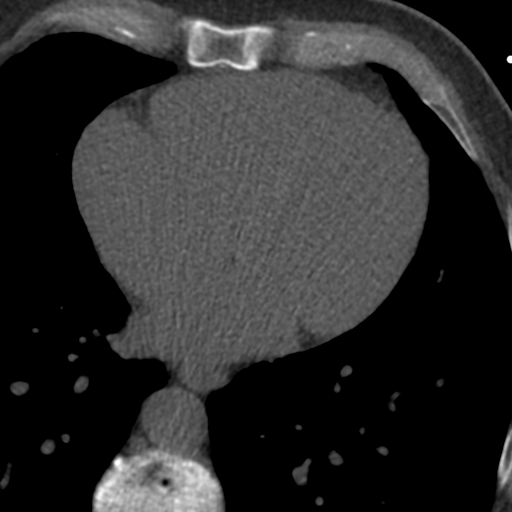
[im 31/51  vessel]
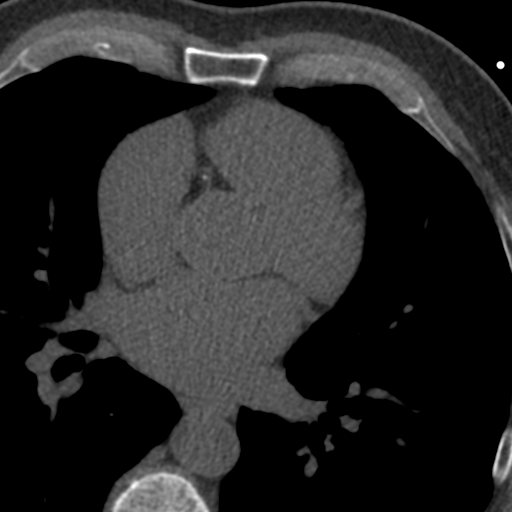
[im 41/51  vessel]
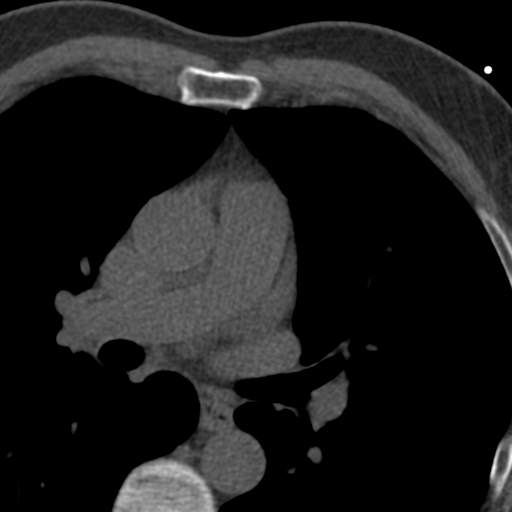

[Series 4: soft full fov 73 % · axial · 0.66mm/px · z∈[+1156,+1255]mm · 5 of 51 slices shown, 7 images]
[im 9/51  vessel]
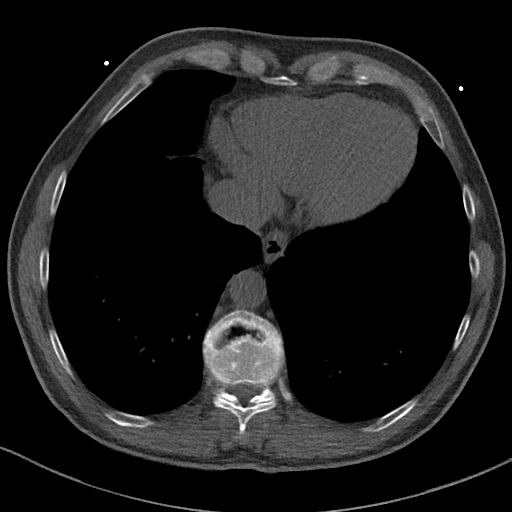
[im 9/51  lung]
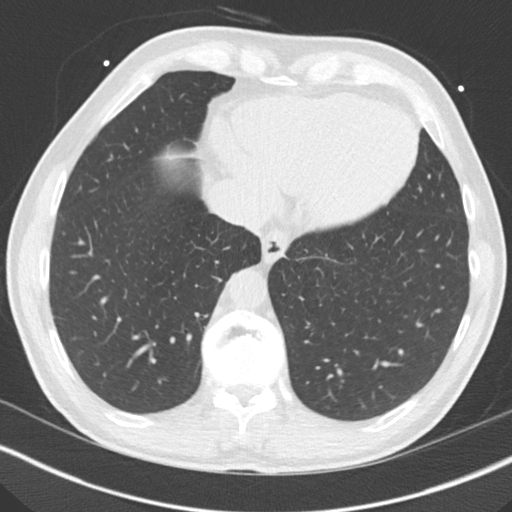
[im 17/51  vessel]
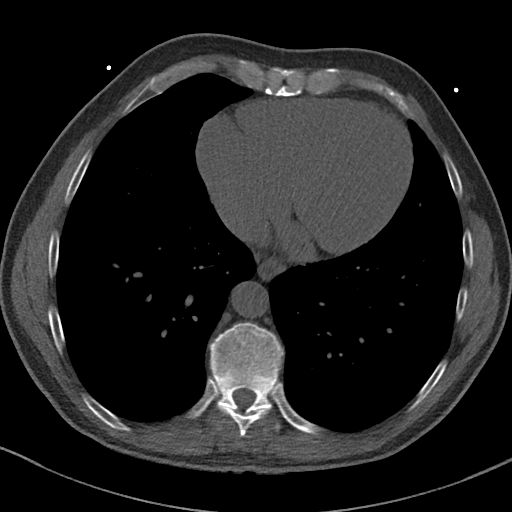
[im 26/51  vessel]
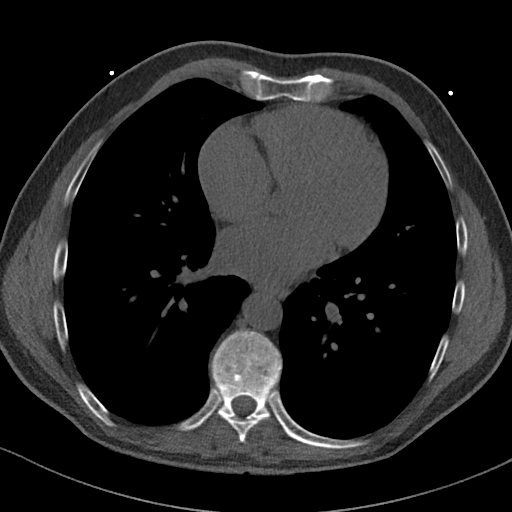
[im 34/51  vessel]
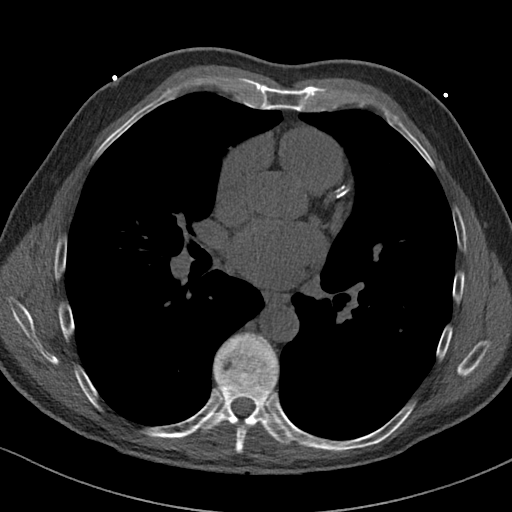
[im 42/51  vessel]
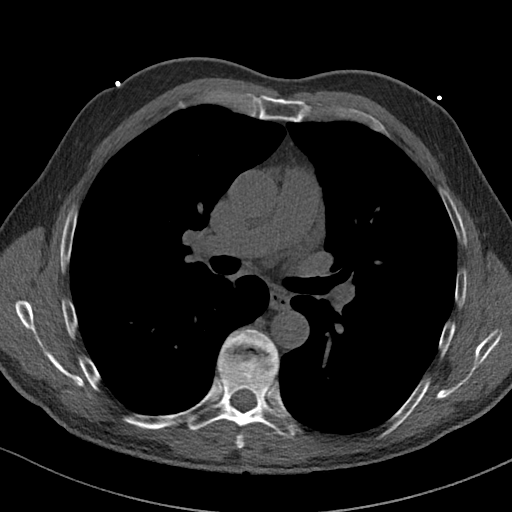
[im 42/51  lung]
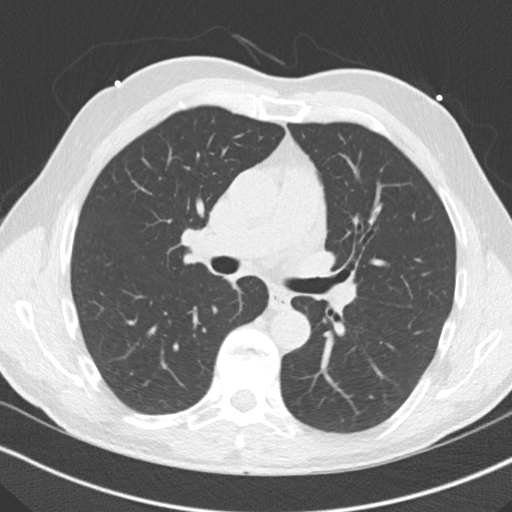

[Series 5: lungs 73 % · axial · 0.66mm/px · z∈[+1156,+1255]mm · 5 of 51 slices shown]
[im 9/51  vessel]
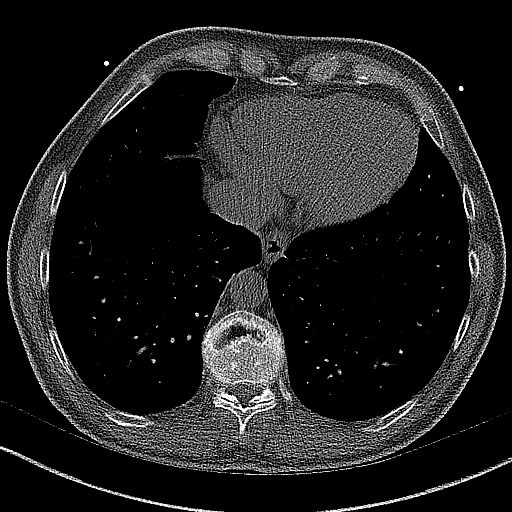
[im 17/51  vessel]
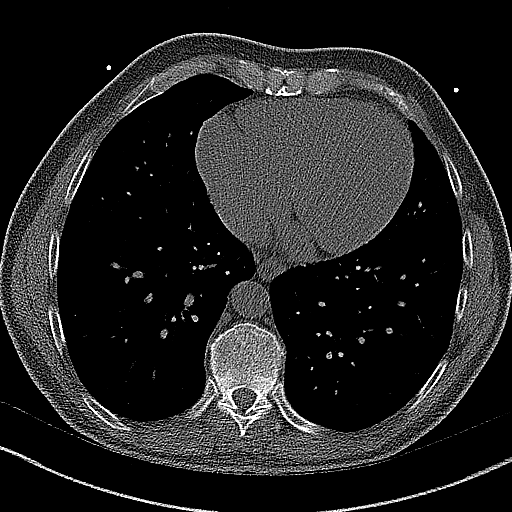
[im 26/51  vessel]
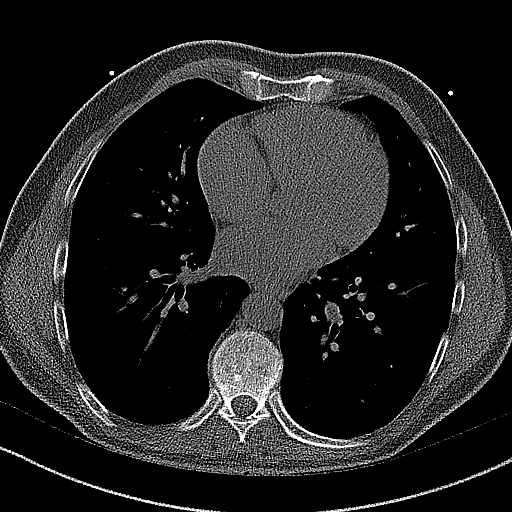
[im 34/51  vessel]
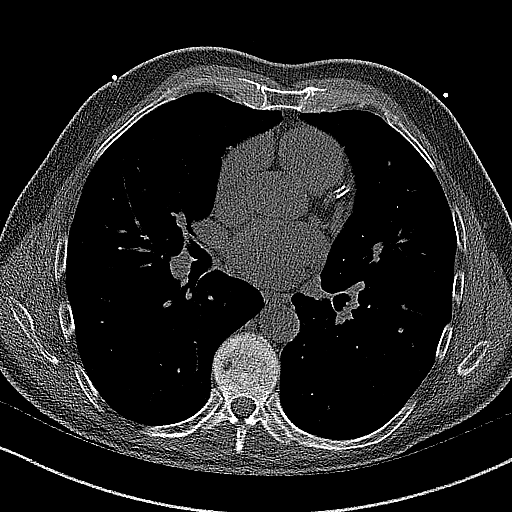
[im 42/51  vessel]
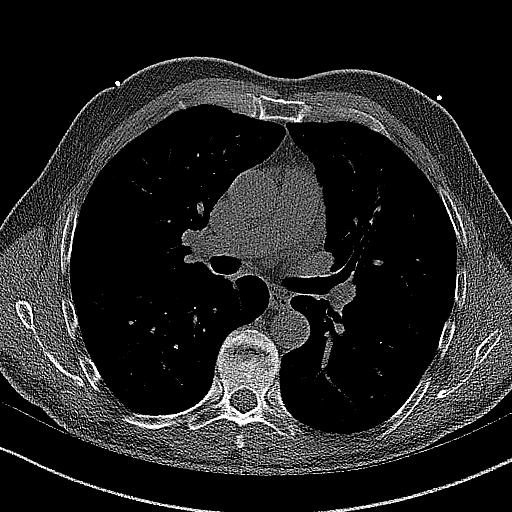

[14 of 20 positions shown; findings below may reference images not displayed]

FINDINGS: Vascular: Heart is borderline in size. Aorta normal caliber.

Mediastinum/Nodes: No adenopathy.

Lungs/Pleura: No confluent opacities or effusions. Small nodules
along the minor fissure on the right measuring up to 5 mm on image
22. 2 mm subpleural nodule in the left lower lobe on image 29.
Calcified subpleural nodule in the right lower lobe on image 42,
likely granuloma. No effusions

Upper Abdomen: Imaging into the upper abdomen demonstrates no acute
findings.

Musculoskeletal: Chest wall soft tissues are unremarkable. No acute
bony abnormality.
IMPRESSION: Scattered small pulmonary nodules, 5 mm or smaller. No follow-up
needed if patient is low-risk (and has no known or suspected primary
neoplasm). Non-contrast chest CT can be considered in 12 months if
patient is high-risk. This recommendation follows the consensus
statement: Guidelines for Management of Incidental Pulmonary Nodules
Detected on CT Images: From the [HOSPITAL] 8248; Radiology
FINDINGS: Coronary arteries: Normal origins.

Coronary Calcium Score:

Left main: 0

Left anterior descending artery: 326

Left circumflex artery:

Right coronary artery:

Total: 364

Percentile: 75th

Pericardium: Normal.

Ascending Aorta: Normal caliber.

Non-cardiac: See separate report from [REDACTED].
IMPRESSION: Coronary calcium score of 364. This was 75th percentile for age-,
race-, and sex-matched controls.



If CAC=0, it is reasonable to withhold statin therapy and reassess
in 5 to 10 years, as long as higher risk conditions are absent
(diabetes mellitus, family history of premature CHD in first degree
relatives (males <55 years; females <65 years), cigarette smoking,
or LDL >=190 mg/dL).

If CAC is 1 to 99, it is reasonable to initiate statin therapy for
patients >=55 years of age.

If CAC is >=100 or >=75th percentile, it is reasonable to initiate
statin therapy at any age.

Cardiology referral should be considered for patients with CAC
scores >=400 or >=75th percentile.

*3367 AHA/ACC/AACVPR/AAPA/ABC/NICH/SCHANTZ/ROBERTO/[place]/MDANI/AUJLA/SHALLEY
Guideline on the Management of Blood Cholesterol: A Report of the
American College of Cardiology/American Heart Association Task Force
on Clinical Practice Guidelines. J Am Coll Cardiol.
2989;73(24):4709-4149.

*** End of Addendum ***
EXAM:
OVER-READ INTERPRETATION  CT CHEST

The following report is an over-read performed by radiologist Dr.
Lorenz Jumper [REDACTED] on 07/31/2020. This over-read
does not include interpretation of cardiac or coronary anatomy or
pathology. The coronary calcium score interpretation by the
cardiologist is attached.
FINDINGS: Vascular: Heart is borderline in size. Aorta normal caliber.

Mediastinum/Nodes: No adenopathy.

Lungs/Pleura: No confluent opacities or effusions. Small nodules
along the minor fissure on the right measuring up to 5 mm on image
22. 2 mm subpleural nodule in the left lower lobe on image 29.
Calcified subpleural nodule in the right lower lobe on image 42,
likely granuloma. No effusions

Upper Abdomen: Imaging into the upper abdomen demonstrates no acute
findings.

Musculoskeletal: Chest wall soft tissues are unremarkable. No acute
bony abnormality.
IMPRESSION: Scattered small pulmonary nodules, 5 mm or smaller. No follow-up
needed if patient is low-risk (and has no known or suspected primary
neoplasm). Non-contrast chest CT can be considered in 12 months if
patient is high-risk. This recommendation follows the consensus
statement: Guidelines for Management of Incidental Pulmonary Nodules
Detected on CT Images: From the [HOSPITAL] 8248; Radiology

## 2022-09-01 ENCOUNTER — Other Ambulatory Visit: Payer: Self-pay | Admitting: Cardiovascular Disease

## 2022-09-09 DIAGNOSIS — D3131 Benign neoplasm of right choroid: Secondary | ICD-10-CM | POA: Diagnosis not present

## 2022-09-09 DIAGNOSIS — H5213 Myopia, bilateral: Secondary | ICD-10-CM | POA: Diagnosis not present

## 2022-10-01 ENCOUNTER — Other Ambulatory Visit: Payer: Self-pay | Admitting: Cardiovascular Disease

## 2022-11-30 ENCOUNTER — Other Ambulatory Visit: Payer: Self-pay | Admitting: Behavioral Health

## 2022-11-30 DIAGNOSIS — F319 Bipolar disorder, unspecified: Secondary | ICD-10-CM

## 2022-12-15 NOTE — Telephone Encounter (Signed)
error 

## 2022-12-20 ENCOUNTER — Encounter: Payer: Self-pay | Admitting: Behavioral Health

## 2022-12-20 ENCOUNTER — Ambulatory Visit (INDEPENDENT_AMBULATORY_CARE_PROVIDER_SITE_OTHER): Payer: PPO | Admitting: Behavioral Health

## 2022-12-20 DIAGNOSIS — F319 Bipolar disorder, unspecified: Secondary | ICD-10-CM | POA: Diagnosis not present

## 2022-12-20 DIAGNOSIS — Z79899 Other long term (current) drug therapy: Secondary | ICD-10-CM

## 2022-12-20 MED ORDER — DIVALPROEX SODIUM ER 500 MG PO TB24: ORAL_TABLET | ORAL | 1 refills | Status: DC

## 2022-12-20 NOTE — Progress Notes (Signed)
[facility]  Patient ID: Daniel Shannon,  MRN: [id number]  PCP: Cleatis Polka., MD  Date of Evaluation: 12/20/2022 Time spent:30 minutes  Chief Complaint:   HISTORY/CURRENT STATUS: HPI  67 year old male presents to this office for follow up and medication management. No negative changes since last visit.He appear more upbeat and is smiling. He is very alert and focused today. Speech is normal. Wife is present with his verbal consent. Last visit 6 months ago. She says he has been like his old self. Would like to consider  continuing to wean down on Depakote. He says his anxiety today is 1/10 and depression is 1/10. He reports sleeping 6-7 hours most nights. He denies any recent mania, no psychosis. Denies SI/HI. He request 6 month follow up.    Past psychiatric medications: Seroquel Olanzapine Depakote    Individual Medical History/ Review of Systems: Changes? :No   Allergies: Patient has no known allergies.  Current Medications:  Current Outpatient Medications:    aspirin EC 81 MG tablet, Take 1 tablet (81 mg total) by mouth daily. Swallow whole. (Patient not taking: Reported on 11/24/2020), Disp: 90 tablet, Rfl: 3   cholecalciferol (VITAMIN D3) 25 MCG (1000 UNIT) tablet, Take 1,000 Units by mouth daily. (Patient not taking: Reported on 11/24/2020), Disp: , Rfl:    divalproex (DEPAKOTE ER) 250 MG 24 hr tablet, TAKE 1 TABLET(250 MG) BY MOUTH DAILY, Disp: 90 tablet, Rfl: 2   divalproex (DEPAKOTE ER) 500 MG 24 hr tablet, TAKE 2 TABLETS(1000 MG) BY MOUTH DAILY, Disp: 180 tablet, Rfl: 0   divalproex (DEPAKOTE) 250 MG DR tablet, Take 500 mg by mouth 2 (two) times daily. (Patient not taking: Reported on 12/13/2020), Disp: , Rfl:    lurasidone (LATUDA) 40 MG TABS tablet, Take 1 tablet (40 mg total) by mouth daily with breakfast., Disp: 90 tablet, Rfl: 0   rosuvastatin (CRESTOR) 20 MG tablet, TAKE 1 TABLET(20 MG) BY MOUTH DAILY, Disp: 30 tablet, Rfl: 0 Medication Side Effects:  none  Family Medical/ Social History: Changes? No  MENTAL HEALTH EXAM:  There were no vitals taken for this visit.There is no height or weight on file to calculate BMI.  General Appearance: Casual, Neat, and Well Groomed  Eye Contact:  Good  Speech:  Clear and Coherent  Volume:  Normal  Mood:  NA  Affect:  Appropriate  Thought Process:  Coherent  Orientation:  Full (Time, Place, and Person)  Thought Content: Logical   Suicidal Thoughts:  No  Homicidal Thoughts:  No  Memory:  WNL  Judgement:  Good  Insight:  Good  Psychomotor Activity:  Normal  Concentration:  Concentration: Good  Recall:  Good  Fund of Knowledge: Good  Language: Good  Assets:  Desire for Improvement  ADL's:  Intact  Cognition: WNL  Prognosis:  Good    DIAGNOSES:    ICD-10-CM   1. High risk medication use  Z79.899     2. Bipolar I disorder ([facility])  F31.9       Receiving Psychotherapy: No    RECOMMENDATIONS:   Reduce Depakote to 500 mg ER single dose at bedtime.  Provided emergency contact information  Will follow up in 6 months per patients request. He did not want to come in sooner.  To report worsening symptoms promptly Provided emergency contact information  Reviewed PDMP   Greater than 50% of 30 min face to face time with patient was spent on counseling and coordination of care.  His depression  has markedly improved since last visit though. Wife says that he has returned to baseline since last visit and the rest of the family has noticed. He agrees to continue with Depakote for now. They are going on vacation for two weeks but will reduce Depakote  to 500 mg when returning home.     Joan Flores, NP

## 2023-01-16 DIAGNOSIS — E785 Hyperlipidemia, unspecified: Secondary | ICD-10-CM | POA: Diagnosis not present

## 2023-01-16 DIAGNOSIS — R7301 Impaired fasting glucose: Secondary | ICD-10-CM | POA: Diagnosis not present

## 2023-01-16 DIAGNOSIS — Z1389 Encounter for screening for other disorder: Secondary | ICD-10-CM | POA: Diagnosis not present

## 2023-01-25 DIAGNOSIS — D696 Thrombocytopenia, unspecified: Secondary | ICD-10-CM | POA: Diagnosis not present

## 2023-01-25 DIAGNOSIS — Z0000 Encounter for general adult medical examination without abnormal findings: Secondary | ICD-10-CM | POA: Diagnosis not present

## 2023-01-25 DIAGNOSIS — I2510 Atherosclerotic heart disease of native coronary artery without angina pectoris: Secondary | ICD-10-CM | POA: Diagnosis not present

## 2023-01-25 DIAGNOSIS — F3174 Bipolar disorder, in full remission, most recent episode manic: Secondary | ICD-10-CM | POA: Diagnosis not present

## 2023-01-25 DIAGNOSIS — Z1331 Encounter for screening for depression: Secondary | ICD-10-CM | POA: Diagnosis not present

## 2023-01-25 DIAGNOSIS — R7301 Impaired fasting glucose: Secondary | ICD-10-CM | POA: Diagnosis not present

## 2023-01-25 DIAGNOSIS — E7800 Pure hypercholesterolemia, unspecified: Secondary | ICD-10-CM | POA: Diagnosis not present

## 2023-01-25 DIAGNOSIS — Z1339 Encounter for screening examination for other mental health and behavioral disorders: Secondary | ICD-10-CM | POA: Diagnosis not present

## 2023-01-25 DIAGNOSIS — R82998 Other abnormal findings in urine: Secondary | ICD-10-CM | POA: Diagnosis not present

## 2023-01-25 DIAGNOSIS — G319 Degenerative disease of nervous system, unspecified: Secondary | ICD-10-CM | POA: Diagnosis not present

## 2023-03-02 ENCOUNTER — Other Ambulatory Visit: Payer: Self-pay | Admitting: Behavioral Health

## 2023-03-02 DIAGNOSIS — F319 Bipolar disorder, unspecified: Secondary | ICD-10-CM

## 2023-03-15 ENCOUNTER — Other Ambulatory Visit: Payer: Self-pay | Admitting: Medical Genetics

## 2023-03-22 ENCOUNTER — Other Ambulatory Visit: Payer: Self-pay | Admitting: Medical Genetics

## 2023-03-23 ENCOUNTER — Other Ambulatory Visit (HOSPITAL_COMMUNITY)
Admission: RE | Admit: 2023-03-23 | Discharge: 2023-03-23 | Disposition: A | Discharge status: Home / Self Care | Payer: Self-pay | Source: Ambulatory Visit | Attending: Medical Genetics | Admitting: Medical Genetics

## 2023-04-03 LAB — GENECONNECT MOLECULAR SCREEN: Genetic Analysis Overall Interpretation: NEGATIVE

## 2023-04-24 ENCOUNTER — Other Ambulatory Visit (HOSPITAL_COMMUNITY): Payer: PPO

## 2023-06-19 ENCOUNTER — Ambulatory Visit: Payer: PPO | Admitting: Behavioral Health

## 2023-07-17 ENCOUNTER — Ambulatory Visit: Admitting: Behavioral Health

## 2023-07-17 ENCOUNTER — Encounter: Payer: Self-pay | Admitting: Behavioral Health

## 2023-07-17 DIAGNOSIS — F319 Bipolar disorder, unspecified: Secondary | ICD-10-CM | POA: Diagnosis not present

## 2023-07-17 MED ORDER — DIVALPROEX SODIUM ER 500 MG PO TB24: ORAL_TABLET | ORAL | 2 refills | Status: AC

## 2023-07-17 NOTE — Progress Notes (Signed)
 [facility]  Patient ID: Daniel Shannon,  MRN: [id number]  PCP: Cleatis Polka., MD  Date of Evaluation: 07/17/2023 Time spent:30 minutes  Chief Complaint:  Chief Complaint   Follow-up; Medication Refill; Patient Education; Depression; Manic Behavior     HISTORY/CURRENT STATUS: HPI  68 year old male presents to this office for follow up and medication management. Continues to be asymptomatic.  He appear more upbeat and is smiling. He is very alert and focused today. Speech is normal. Wife is present with his verbal consent. Last visit 6 months ago. She says he has been like his old self. Would like to consider  continuing to wean down on Depakote. He says his anxiety today is 1/10 and depression is 1/10. He reports sleeping 6-7 hours most nights. He denies any recent mania, no psychosis. Denies SI/HI. He request 6 month follow up.    Past psychiatric medications: Seroquel Olanzapine Depakote Individual Medical History/ Review of Systems: Changes? :No   Allergies: Patient has no known allergies.  Current Medications:  Current Outpatient Medications:    aspirin EC 81 MG tablet, Take 1 tablet (81 mg total) by mouth daily. Swallow whole. (Patient not taking: Reported on 11/24/2020), Disp: 90 tablet, Rfl: 3   cholecalciferol (VITAMIN D3) 25 MCG (1000 UNIT) tablet, Take 1,000 Units by mouth daily. (Patient not taking: Reported on 11/24/2020), Disp: , Rfl:    divalproex (DEPAKOTE ER) 500 MG 24 hr tablet, Take one tablet by mouth daily at bedtime., Disp: 90 tablet, Rfl: 2   rosuvastatin (CRESTOR) 20 MG tablet, TAKE 1 TABLET(20 MG) BY MOUTH DAILY, Disp: 30 tablet, Rfl: 0 Medication Side Effects: none  Family Medical/ Social History: Changes? No  MENTAL HEALTH EXAM:  There were no vitals taken for this visit.There is no height or weight on file to calculate BMI.  General Appearance: Casual and Neat  Eye Contact:  NA  Speech:  Clear and Coherent  Volume:  Normal  Mood:   NA  Affect:  Appropriate  Thought Process:  Coherent  Orientation:  Full (Time, Place, and Person)  Thought Content: Logical   Suicidal Thoughts:  No  Homicidal Thoughts:  No  Memory:  WNL  Judgement:  Good  Insight:  Good  Psychomotor Activity:  Normal  Concentration:  Concentration: Good  Recall:  Good  Fund of Knowledge: Good  Language: Good  Assets:  Desire for Improvement  ADL's:  Intact  Cognition: WNL  Prognosis:  Good    DIAGNOSES:    ICD-10-CM   1. Bipolar I disorder (HCC)  F31.9 divalproex (DEPAKOTE ER) 500 MG 24 hr tablet      Receiving Psychotherapy: No    RECOMMENDATIONS:   Reduce Depakote to 500 mg ER single dose at bedtime.  Provided emergency contact information  Will follow up in 6 months per patients request. He did not want to come in sooner.  To report worsening symptoms promptly Provided emergency contact information  Reviewed PDMP   Greater than 50% of 30 min face to face time with patient was spent on counseling and coordination of care.  No depression or mania in over two year.  He agrees to continue with Depakote for now. To consider dosage decrease in 6 months.       Joan Flores, NP

## 2023-07-19 ENCOUNTER — Telehealth: Payer: Self-pay | Admitting: Behavioral Health

## 2023-07-19 NOTE — Telephone Encounter (Signed)
 Pt lvm that he would like to talk to brian when he has a free moment. Please call him at [phone]

## 2024-01-22 ENCOUNTER — Ambulatory Visit: Admitting: Behavioral Health

## 2024-01-22 ENCOUNTER — Encounter: Payer: Self-pay | Admitting: Behavioral Health

## 2024-01-22 DIAGNOSIS — F319 Bipolar disorder, unspecified: Secondary | ICD-10-CM | POA: Diagnosis not present

## 2024-01-22 MED ORDER — DIVALPROEX SODIUM ER 250 MG PO TB24: 250.0000 mg | ORAL_TABLET | Freq: Every day | ORAL | 1 refills | Status: AC

## 2024-01-22 NOTE — Progress Notes (Signed)
 [facility]  Patient ID: Daniel Shannon,  MRN: [id number]  PCP: Loreli Elsie JONETTA Mickey., MD  Date of Evaluation: 01/22/2024 Time spent:30 minutes  Chief Complaint:  Chief Complaint   Depression; Follow-up; Medication Refill; Patient Education     HISTORY/CURRENT STATUS: HPI 68 year old male presents to this office for follow up and medication management. Continues to be asymptomatic. Very sharp and cognitively alert.  Speech is normal. Wife is present with his verbal consent. Last visit 6 months ago. Says that he has done more research and does not believe it is worth the risk of taking Depakote  as protective factor for dementia has he continues to be asymptomatic for any psychosis, depression, or mania. His intent is to wean completely off Depakote  in the next couple of months.  He says his anxiety today is 1/10 and depression is 1/10. He reports sleeping 6-7 hours most nights. He denies any recent mania, no psychosis. Denies SI/HI. He request 6 month follow up.    Past psychiatric medications: Seroquel Olanzapine  Depakote  Individual Medical History/ Review of Systems: Changes? :No   Allergies: Patient has no known allergies.  Current Medications:  Current Outpatient Medications:    divalproex  (DEPAKOTE  ER) 250 MG 24 hr tablet, Take 1 tablet (250 mg total) by mouth daily., Disp: 90 tablet, Rfl: 1   aspirin  EC 81 MG tablet, Take 1 tablet (81 mg total) by mouth daily. Swallow whole. (Patient not taking: Reported on 11/24/2020), Disp: 90 tablet, Rfl: 3   cholecalciferol (VITAMIN D3) 25 MCG (1000 UNIT) tablet, Take 1,000 Units by mouth daily. (Patient not taking: Reported on 11/24/2020), Disp: , Rfl:    divalproex  (DEPAKOTE  ER) 500 MG 24 hr tablet, Take one tablet by mouth daily at bedtime., Disp: 90 tablet, Rfl: 2   rosuvastatin  (CRESTOR ) 20 MG tablet, TAKE 1 TABLET(20 MG) BY MOUTH DAILY, Disp: 30 tablet, Rfl: 0 Medication Side Effects: none  Family Medical/ Social History:  Changes? No  MENTAL HEALTH EXAM:  There were no vitals taken for this visit.There is no height or weight on file to calculate BMI.  General Appearance: Casual  Eye Contact:  NA  Speech:  Clear and Coherent  Volume:  Normal  Mood:  NA  Affect:  Appropriate  Thought Process:  Coherent  Orientation:  Full (Time, Place, and Person)  Thought Content: Logical   Suicidal Thoughts:  No  Homicidal Thoughts:  No  Memory:  WNL  Judgement:  Good  Insight:  Good  Psychomotor Activity:  Normal  Concentration:  Concentration: Good  Recall:  Good  Fund of Knowledge: Good  Language: Good  Assets:  Desire for Improvement  ADL's:  Intact  Cognition: WNL  Prognosis:  Good    DIAGNOSES:    ICD-10-CM   1. Bipolar I disorder (HCC)  F31.9 divalproex  (DEPAKOTE  ER) 250 MG 24 hr tablet      Receiving Psychotherapy: No    RECOMMENDATIONS:  Greater than 50% of 30 min face to face time with patient was spent on counseling and coordination of care.  No depression or mania in over 2.5 years.  Reduce Depakote  to 250 mg ER single dose at bedtime for next 2 months, then every other day for 2 weeks, then stop medication. Pt expresses understanding of risk of relapse. He advises that he is willing to take that risk. Wife says that she will monitor closely and he agrees to contact this office with negative changes, or seek emergency care.  Provided emergency contact information  Will follow up in 6 months per patients request. He did not want to come in sooner.  To report worsening symptoms promptly Provided emergency contact information  Reviewed PDMP        Redell DELENA Pizza, NP

## 2024-02-07 DIAGNOSIS — Z0189 Encounter for other specified special examinations: Secondary | ICD-10-CM | POA: Diagnosis not present

## 2024-02-07 DIAGNOSIS — Z125 Encounter for screening for malignant neoplasm of prostate: Secondary | ICD-10-CM | POA: Diagnosis not present

## 2024-02-08 DIAGNOSIS — E7800 Pure hypercholesterolemia, unspecified: Secondary | ICD-10-CM | POA: Diagnosis not present

## 2024-02-08 DIAGNOSIS — R7303 Prediabetes: Secondary | ICD-10-CM | POA: Diagnosis not present

## 2024-02-08 DIAGNOSIS — I2510 Atherosclerotic heart disease of native coronary artery without angina pectoris: Secondary | ICD-10-CM | POA: Diagnosis not present

## 2024-02-19 DIAGNOSIS — R7301 Impaired fasting glucose: Secondary | ICD-10-CM | POA: Diagnosis not present

## 2024-02-19 DIAGNOSIS — I2510 Atherosclerotic heart disease of native coronary artery without angina pectoris: Secondary | ICD-10-CM | POA: Diagnosis not present

## 2024-02-19 DIAGNOSIS — H5213 Myopia, bilateral: Secondary | ICD-10-CM | POA: Diagnosis not present

## 2024-02-19 DIAGNOSIS — Z1339 Encounter for screening examination for other mental health and behavioral disorders: Secondary | ICD-10-CM | POA: Diagnosis not present

## 2024-02-19 DIAGNOSIS — Z0000 Encounter for general adult medical examination without abnormal findings: Secondary | ICD-10-CM | POA: Diagnosis not present

## 2024-02-19 DIAGNOSIS — Z1331 Encounter for screening for depression: Secondary | ICD-10-CM | POA: Diagnosis not present

## 2024-02-19 DIAGNOSIS — D3131 Benign neoplasm of right choroid: Secondary | ICD-10-CM | POA: Diagnosis not present

## 2024-02-19 DIAGNOSIS — E7800 Pure hypercholesterolemia, unspecified: Secondary | ICD-10-CM | POA: Diagnosis not present

## 2024-02-19 DIAGNOSIS — G319 Degenerative disease of nervous system, unspecified: Secondary | ICD-10-CM | POA: Diagnosis not present

## 2024-02-19 DIAGNOSIS — R7401 Elevation of levels of liver transaminase levels: Secondary | ICD-10-CM | POA: Diagnosis not present

## 2024-02-19 DIAGNOSIS — F3174 Bipolar disorder, in full remission, most recent episode manic: Secondary | ICD-10-CM | POA: Diagnosis not present

## 2024-02-19 DIAGNOSIS — R82998 Other abnormal findings in urine: Secondary | ICD-10-CM | POA: Diagnosis not present

## 2024-02-19 DIAGNOSIS — D696 Thrombocytopenia, unspecified: Secondary | ICD-10-CM | POA: Diagnosis not present

## 2024-03-21 DIAGNOSIS — K5730 Diverticulosis of large intestine without perforation or abscess without bleeding: Secondary | ICD-10-CM | POA: Diagnosis not present

## 2024-03-21 DIAGNOSIS — D120 Benign neoplasm of cecum: Secondary | ICD-10-CM | POA: Diagnosis not present

## 2024-03-21 DIAGNOSIS — D128 Benign neoplasm of rectum: Secondary | ICD-10-CM | POA: Diagnosis not present

## 2024-03-21 DIAGNOSIS — Z09 Encounter for follow-up examination after completed treatment for conditions other than malignant neoplasm: Secondary | ICD-10-CM | POA: Diagnosis not present

## 2024-03-21 DIAGNOSIS — Z860101 Personal history of adenomatous and serrated colon polyps: Secondary | ICD-10-CM | POA: Diagnosis not present

## 2024-08-05 ENCOUNTER — Ambulatory Visit: Admitting: Behavioral Health
